# Patient Record
Sex: Female | Born: 1945 | Race: Black or African American | Hispanic: No | Marital: Single | Smoking: Never smoker
Health system: Southern US, Community
[De-identification: ages and names within clinical notes are randomized; demographics above are authoritative.]

## PROBLEM LIST (undated history)

## (undated) DIAGNOSIS — T753XXA Motion sickness, initial encounter: Secondary | ICD-10-CM

## (undated) DIAGNOSIS — M1990 Unspecified osteoarthritis, unspecified site: Secondary | ICD-10-CM

## (undated) DIAGNOSIS — R7303 Prediabetes: Secondary | ICD-10-CM

## (undated) DIAGNOSIS — I10 Essential (primary) hypertension: Secondary | ICD-10-CM

## (undated) HISTORY — DX: Prediabetes: R73.03

## (undated) HISTORY — PX: ABDOMINAL HYSTERECTOMY: SHX81

## (undated) HISTORY — PX: CYST EXCISION: SHX5701

## (undated) HISTORY — PX: TONSILLECTOMY: SUR1361

## (undated) HISTORY — PX: FOOT SURGERY: SHX648

---

## 2008-12-09 ENCOUNTER — Ambulatory Visit: Payer: Self-pay | Admitting: Family Medicine

## 2011-08-29 ENCOUNTER — Ambulatory Visit: Payer: Self-pay

## 2011-08-31 ENCOUNTER — Ambulatory Visit: Payer: Self-pay | Admitting: Podiatry

## 2011-12-26 ENCOUNTER — Emergency Department: Payer: Self-pay | Admitting: Emergency Medicine

## 2012-05-24 ENCOUNTER — Ambulatory Visit: Payer: Self-pay | Admitting: Ophthalmology

## 2012-06-06 ENCOUNTER — Ambulatory Visit: Payer: Self-pay | Admitting: Ophthalmology

## 2012-08-31 ENCOUNTER — Ambulatory Visit: Payer: Self-pay

## 2013-09-24 ENCOUNTER — Ambulatory Visit: Payer: Self-pay

## 2014-04-22 ENCOUNTER — Ambulatory Visit: Payer: Self-pay | Admitting: Internal Medicine

## 2014-04-28 ENCOUNTER — Ambulatory Visit: Payer: Self-pay | Admitting: Gastroenterology

## 2015-05-12 DIAGNOSIS — I10 Essential (primary) hypertension: Secondary | ICD-10-CM | POA: Diagnosis not present

## 2015-05-12 DIAGNOSIS — M810 Age-related osteoporosis without current pathological fracture: Secondary | ICD-10-CM | POA: Diagnosis not present

## 2015-08-25 DIAGNOSIS — L309 Dermatitis, unspecified: Secondary | ICD-10-CM | POA: Diagnosis not present

## 2015-09-02 DIAGNOSIS — L309 Dermatitis, unspecified: Secondary | ICD-10-CM | POA: Diagnosis not present

## 2015-09-02 DIAGNOSIS — I10 Essential (primary) hypertension: Secondary | ICD-10-CM | POA: Diagnosis not present

## 2015-10-29 DIAGNOSIS — R87810 Cervical high risk human papillomavirus (HPV) DNA test positive: Secondary | ICD-10-CM | POA: Diagnosis not present

## 2015-10-29 DIAGNOSIS — M79672 Pain in left foot: Secondary | ICD-10-CM | POA: Diagnosis not present

## 2015-10-29 DIAGNOSIS — M810 Age-related osteoporosis without current pathological fracture: Secondary | ICD-10-CM | POA: Diagnosis not present

## 2015-10-29 DIAGNOSIS — D485 Neoplasm of uncertain behavior of skin: Secondary | ICD-10-CM | POA: Diagnosis not present

## 2015-10-29 DIAGNOSIS — Z124 Encounter for screening for malignant neoplasm of cervix: Secondary | ICD-10-CM | POA: Diagnosis not present

## 2015-10-29 DIAGNOSIS — I10 Essential (primary) hypertension: Secondary | ICD-10-CM | POA: Diagnosis not present

## 2015-10-29 DIAGNOSIS — Z0001 Encounter for general adult medical examination with abnormal findings: Secondary | ICD-10-CM | POA: Diagnosis not present

## 2015-11-09 ENCOUNTER — Ambulatory Visit
Admission: RE | Admit: 2015-11-09 | Discharge: 2015-11-09 | Disposition: A | Discharge status: Home / Self Care | Payer: Medicare Other | Source: Ambulatory Visit | Attending: Nurse Practitioner | Admitting: Nurse Practitioner

## 2015-11-09 ENCOUNTER — Other Ambulatory Visit: Payer: Self-pay | Admitting: Nurse Practitioner

## 2015-11-09 DIAGNOSIS — Z1231 Encounter for screening mammogram for malignant neoplasm of breast: Secondary | ICD-10-CM | POA: Diagnosis not present

## 2015-11-09 DIAGNOSIS — M25552 Pain in left hip: Secondary | ICD-10-CM | POA: Insufficient documentation

## 2015-11-09 DIAGNOSIS — Z0001 Encounter for general adult medical examination with abnormal findings: Secondary | ICD-10-CM | POA: Diagnosis not present

## 2015-11-09 DIAGNOSIS — E559 Vitamin D deficiency, unspecified: Secondary | ICD-10-CM | POA: Diagnosis not present

## 2015-11-09 DIAGNOSIS — E782 Mixed hyperlipidemia: Secondary | ICD-10-CM | POA: Diagnosis not present

## 2015-11-09 DIAGNOSIS — I10 Essential (primary) hypertension: Secondary | ICD-10-CM | POA: Diagnosis not present

## 2016-01-13 DIAGNOSIS — L821 Other seborrheic keratosis: Secondary | ICD-10-CM | POA: Diagnosis not present

## 2016-07-01 DIAGNOSIS — M818 Other osteoporosis without current pathological fracture: Secondary | ICD-10-CM | POA: Diagnosis not present

## 2016-07-01 DIAGNOSIS — Z0000 Encounter for general adult medical examination without abnormal findings: Secondary | ICD-10-CM | POA: Diagnosis not present

## 2016-07-01 DIAGNOSIS — I10 Essential (primary) hypertension: Secondary | ICD-10-CM | POA: Diagnosis not present

## 2016-10-19 DIAGNOSIS — H2513 Age-related nuclear cataract, bilateral: Secondary | ICD-10-CM | POA: Diagnosis not present

## 2016-10-24 DIAGNOSIS — H40003 Preglaucoma, unspecified, bilateral: Secondary | ICD-10-CM | POA: Diagnosis not present

## 2016-11-09 DIAGNOSIS — M810 Age-related osteoporosis without current pathological fracture: Secondary | ICD-10-CM | POA: Diagnosis not present

## 2016-11-09 DIAGNOSIS — I10 Essential (primary) hypertension: Secondary | ICD-10-CM | POA: Diagnosis not present

## 2016-11-09 DIAGNOSIS — R079 Chest pain, unspecified: Secondary | ICD-10-CM | POA: Diagnosis not present

## 2016-11-18 DIAGNOSIS — M8589 Other specified disorders of bone density and structure, multiple sites: Secondary | ICD-10-CM | POA: Diagnosis not present

## 2016-11-18 DIAGNOSIS — M8588 Other specified disorders of bone density and structure, other site: Secondary | ICD-10-CM | POA: Diagnosis not present

## 2016-11-18 DIAGNOSIS — I10 Essential (primary) hypertension: Secondary | ICD-10-CM | POA: Diagnosis not present

## 2016-11-18 DIAGNOSIS — Z1231 Encounter for screening mammogram for malignant neoplasm of breast: Secondary | ICD-10-CM | POA: Diagnosis not present

## 2016-11-18 DIAGNOSIS — M85852 Other specified disorders of bone density and structure, left thigh: Secondary | ICD-10-CM | POA: Diagnosis not present

## 2016-11-18 DIAGNOSIS — E282 Polycystic ovarian syndrome: Secondary | ICD-10-CM | POA: Diagnosis not present

## 2016-11-18 DIAGNOSIS — Z0001 Encounter for general adult medical examination with abnormal findings: Secondary | ICD-10-CM | POA: Diagnosis not present

## 2016-11-18 DIAGNOSIS — E559 Vitamin D deficiency, unspecified: Secondary | ICD-10-CM | POA: Diagnosis not present

## 2016-11-18 DIAGNOSIS — Z780 Asymptomatic menopausal state: Secondary | ICD-10-CM | POA: Diagnosis not present

## 2016-11-23 DIAGNOSIS — H2513 Age-related nuclear cataract, bilateral: Secondary | ICD-10-CM | POA: Diagnosis not present

## 2016-11-30 ENCOUNTER — Encounter: Payer: Self-pay | Admitting: *Deleted

## 2016-12-01 NOTE — Discharge Instructions (Signed)
Cataract Surgery, Care After °Refer to this sheet in the next few weeks. These instructions provide you with information about caring for yourself after your procedure. Your health care provider may also give you more specific instructions. Your treatment has been planned according to current medical practices, but problems sometimes occur. Call your health care provider if you have any problems or questions after your procedure. °What can I expect after the procedure? °After the procedure, it is common to have: °· Itching. °· Discomfort. °· Fluid discharge. °· Sensitivity to light and to touch. °· Bruising. °Follow these instructions at home: °Eye Care  °· Check your eye every day for signs of infection. Watch for: °¨ Redness, swelling, or pain. °¨ Fluid, blood, or pus. °¨ Warmth. °¨ Bad smell. °Activity  °· Avoid strenuous activities, such as playing contact sports, for as long as told by your health care provider. °· Do not drive or operate heavy machinery until your health care provider approves. °· Do not bend or lift heavy objects . Bending increases pressure in the eye. You can walk, climb stairs, and do light household chores. °· Ask your health care provider when you can return to work. If you work in a dusty environment, you may be advised to wear protective eyewear for a period of time. °General instructions  °· Take or apply over-the-counter and prescription medicines only as told by your health care provider. This includes eye drops. °· Do not touch or rub your eyes. °· If you were given a protective shield, wear it as told by your health care provider. If you were not given a protective shield, wear sunglasses as told by your health care provider to protect your eyes. °· Keep the area around your eye clean and dry. Avoid swimming or allowing water to hit you directly in the face while showering until told by your health care provider. Keep soap and shampoo out of your eyes. °· Do not put a contact lens  into the affected eye or eyes until your health care provider approves. °· Keep all follow-up visits as told by your health care provider. This is important. °Contact a health care provider if: ° °· You have increased bruising around your eye. °· You have pain that is not helped with medicine. °· You have a fever. °· You have redness, swelling, or pain in your eye. °· You have fluid, blood, or pus coming from your incision. °· Your vision gets worse. °Get help right away if: °· You have sudden vision loss. °This information is not intended to replace advice given to you by your health care provider. Make sure you discuss any questions you have with your health care provider. °Document Released: 03/18/2005 Document Revised: 01/07/2016 Document Reviewed: 07/09/2015 °Elsevier Interactive Patient Education © 2017 [employer] ° ° ° ° °General Anesthesia, Adult, Care After °These instructions provide you with information about caring for yourself after your procedure. Your health care provider may also give you more specific instructions. Your treatment has been planned according to current medical practices, but problems sometimes occur. Call your health care provider if you have any problems or questions after your procedure. °What can I expect after the procedure? °After the procedure, it is common to have: °· Vomiting. °· A sore throat. °· Mental slowness. °It is common to feel: °· Nauseous. °· Cold or shivery. °· Sleepy. °· Tired. °· Sore or achy, even in parts of your body where you did not have surgery. °Follow these instructions at   home: °For at least 24 hours after the procedure:  °· Do not: °¨ Participate in activities where you could fall or become injured. °¨ Drive. °¨ Use heavy machinery. °¨ Drink alcohol. °¨ Take sleeping pills or medicines that cause drowsiness. °¨ Make important decisions or sign legal documents. °¨ Take care of children on your own. °· Rest. °Eating and drinking  °· If you vomit, drink  water, juice, or soup when you can drink without vomiting. °· Drink enough fluid to keep your urine clear or pale yellow. °· Make sure you have little or no nausea before eating solid foods. °· Follow the diet recommended by your health care provider. °General instructions  °· Have a responsible adult stay with you until you are awake and alert. °· Return to your normal activities as told by your health care provider. Ask your health care provider what activities are safe for you. °· Take over-the-counter and prescription medicines only as told by your health care provider. °· If you smoke, do not smoke without supervision. °· Keep all follow-up visits as told by your health care provider. This is important. °Contact a health care provider if: °· You continue to have nausea or vomiting at home, and medicines are not helpful. °· You cannot drink fluids or start eating again. °· You cannot urinate after 8-12 hours. °· You develop a skin rash. °· You have fever. °· You have increasing redness at the site of your procedure. °Get help right away if: °· You have difficulty breathing. °· You have chest pain. °· You have unexpected bleeding. °· You feel that you are having a life-threatening or urgent problem. °This information is not intended to replace advice given to you by your health care provider. Make sure you discuss any questions you have with your health care provider. °Document Released: 12/05/2000 Document Revised: 02/01/2016 Document Reviewed: 08/13/2015 °Elsevier Interactive Patient Education © 2017 [employer] ° °

## 2016-12-06 ENCOUNTER — Ambulatory Visit: Payer: Medicare HMO | Admitting: Anesthesiology

## 2016-12-06 ENCOUNTER — Ambulatory Visit
Admission: RE | Admit: 2016-12-06 | Discharge: 2016-12-06 | Disposition: A | Discharge status: Home / Self Care | Payer: Medicare HMO | Source: Ambulatory Visit | Attending: Ophthalmology | Admitting: Ophthalmology

## 2016-12-06 ENCOUNTER — Encounter
Admission: RE | Disposition: A | Discharge status: Home / Self Care | Payer: Self-pay | Source: Ambulatory Visit | Attending: Ophthalmology

## 2016-12-06 DIAGNOSIS — Z79899 Other long term (current) drug therapy: Secondary | ICD-10-CM | POA: Diagnosis not present

## 2016-12-06 DIAGNOSIS — H2181 Floppy iris syndrome: Secondary | ICD-10-CM | POA: Diagnosis not present

## 2016-12-06 DIAGNOSIS — H2512 Age-related nuclear cataract, left eye: Secondary | ICD-10-CM | POA: Insufficient documentation

## 2016-12-06 DIAGNOSIS — M1990 Unspecified osteoarthritis, unspecified site: Secondary | ICD-10-CM | POA: Insufficient documentation

## 2016-12-06 DIAGNOSIS — H2513 Age-related nuclear cataract, bilateral: Secondary | ICD-10-CM | POA: Diagnosis not present

## 2016-12-06 DIAGNOSIS — I10 Essential (primary) hypertension: Secondary | ICD-10-CM | POA: Insufficient documentation

## 2016-12-06 HISTORY — PX: CATARACT EXTRACTION W/PHACO: SHX586

## 2016-12-06 HISTORY — DX: Unspecified osteoarthritis, unspecified site: M19.90

## 2016-12-06 HISTORY — DX: Essential (primary) hypertension: I10

## 2016-12-06 HISTORY — DX: Motion sickness, initial encounter: T75.3XXA

## 2016-12-06 SURGERY — PHACOEMULSIFICATION, CATARACT, WITH IOL INSERTION
Anesthesia: Monitor Anesthesia Care | Site: Eye | Laterality: Left | Wound class: Clean

## 2016-12-06 MED ORDER — FENTANYL CITRATE (PF) 100 MCG/2ML IJ SOLN
INTRAMUSCULAR | Status: DC | PRN
  Administered 2016-12-06: 100 ug via INTRAVENOUS

## 2016-12-06 MED ORDER — ARMC OPHTHALMIC DILATING DROPS
1.0000 | OPHTHALMIC | Status: DC | PRN
Start: 2016-12-06 — End: 2016-12-06
  Administered 2016-12-06 (×3): 1 via OPHTHALMIC

## 2016-12-06 MED ORDER — MIDAZOLAM HCL 2 MG/2ML IJ SOLN
INTRAMUSCULAR | Status: DC | PRN
  Administered 2016-12-06: 2 mg via INTRAVENOUS

## 2016-12-06 MED ORDER — MOXIFLOXACIN HCL 0.5 % OP SOLN
OPHTHALMIC | Status: DC | PRN
  Administered 2016-12-06: .1 mL via OPHTHALMIC

## 2016-12-06 MED ORDER — BALANCED SALT IO SOLN
INTRAOCULAR | Status: DC | PRN
  Administered 2016-12-06: 1 mL via INTRAOCULAR

## 2016-12-06 MED ORDER — SODIUM HYALURONATE 23 MG/ML IO SOLN
INTRAOCULAR | Status: DC | PRN
  Administered 2016-12-06: 0.6 mL via INTRAOCULAR

## 2016-12-06 MED ORDER — BSS IO SOLN
INTRAOCULAR | Status: DC | PRN
  Administered 2016-12-06: 114 mL via OPHTHALMIC

## 2016-12-06 MED ORDER — SODIUM HYALURONATE 10 MG/ML IO SOLN
INTRAOCULAR | Status: DC | PRN
  Administered 2016-12-06: 0.55 mL via INTRAOCULAR

## 2016-12-06 SURGICAL SUPPLY — 17 items
CANNULA ANT/CHMB 27GA (MISCELLANEOUS) ×2 IMPLANT
CUP MEDICINE 2OZ PLAST GRAD ST (MISCELLANEOUS) ×2 IMPLANT
DISSECTOR HYDRO NUCLEUS 50X22 (MISCELLANEOUS) ×2 IMPLANT
GLOVE BIO SURGEON STRL SZ8 (GLOVE) ×2 IMPLANT
GLOVE SURG LX 7.5 STRW (GLOVE) ×1
GLOVE SURG LX STRL 7.5 STRW (GLOVE) ×1 IMPLANT
GOWN STRL REUS W/ TWL LRG LVL3 (GOWN DISPOSABLE) ×2 IMPLANT
GOWN STRL REUS W/TWL LRG LVL3 (GOWN DISPOSABLE) ×2
LENS IOL TECNIS ITEC 22.0 (Intraocular Lens) ×2 IMPLANT
MARKER SKIN DUAL TIP RULER LAB (MISCELLANEOUS) ×2 IMPLANT
PACK CATARACT (MISCELLANEOUS) ×2 IMPLANT
PACK CATARACT BRASINGTON (MISCELLANEOUS) ×2 IMPLANT
PACK EYE AFTER SURG (MISCELLANEOUS) ×2 IMPLANT
SYR 3ML LL SCALE MARK (SYRINGE) ×2 IMPLANT
SYR TB 1ML LUER SLIP (SYRINGE) ×2 IMPLANT
WATER STERILE IRR 250ML POUR (IV SOLUTION) ×2 IMPLANT
WIPE NON LINTING 3.25X3.25 (MISCELLANEOUS) ×2 IMPLANT

## 2016-12-06 NOTE — Anesthesia Preprocedure Evaluation (Signed)
Anesthesia Evaluation  Patient identified by MRN, date of birth, ID band Patient awake    Reviewed: Allergy & Precautions, NPO status , Patient's Chart, lab work & pertinent test results  Airway Mallampati: I  TM Distance: >3 FB Neck ROM: Full    Dental no notable dental hx.    Pulmonary neg pulmonary ROS,    Pulmonary exam normal        Cardiovascular hypertension, Pt. on medications Normal cardiovascular exam     Neuro/Psych negative neurological ROS     GI/Hepatic negative GI ROS, Neg liver ROS,   Endo/Other  negative endocrine ROS  Renal/GU negative Renal ROS     Musculoskeletal  (+) Arthritis ,   Abdominal   Peds  Hematology negative hematology ROS (+)   Anesthesia Other Findings   Reproductive/Obstetrics                             Anesthesia Physical Anesthesia Plan  ASA: II  Anesthesia Plan: MAC   Post-op Pain Management:    Induction: Intravenous  Airway Management Planned:   Additional Equipment:   Intra-op Plan:   Post-operative Plan:   Informed Consent: I have reviewed the patients History and Physical, chart, labs and discussed the procedure including the risks, benefits and alternatives for the proposed anesthesia with the patient or authorized representative who has indicated his/her understanding and acceptance.     Plan Discussed with: CRNA  Anesthesia Plan Comments:         Anesthesia Quick Evaluation

## 2016-12-06 NOTE — Anesthesia Procedure Notes (Signed)
Procedure Name: MAC Performed by: Mayme Genta Pre-anesthesia Checklist: Patient identified, Emergency Drugs available, Suction available, Timeout performed and Patient being monitored Patient Re-evaluated:Patient Re-evaluated prior to inductionOxygen Delivery Method: Nasal cannula Placement Confirmation: positive ETCO2

## 2016-12-06 NOTE — Op Note (Signed)
OPERATIVE NOTE  Lisa Thompson [id number] 12/06/2016   PREOPERATIVE DIAGNOSIS:  Nuclear sclerotic cataract left eye.  H25.12   POSTOPERATIVE DIAGNOSIS:    Nuclear sclerotic cataract left eye.   2.  Intraoperative floppy iris syndrome.  IFIS.   PROCEDURE:  Phacoemusification with posterior chamber intraocular lens placement of the left eye   LENS:   Implant Name Type Inv. Item Serial No. Manufacturer Lot No. LRB No. Used  LENS IOL DIOP 22.0 - [id number] Intraocular Lens LENS IOL DIOP 22.0 [id number] AMO   Left 1       PCB00 +22.0   ULTRASOUND TIME: 1 minutes 34 seconds.  CDE 14.34   SURGEON:  Benay Pillow, MD, MPH   ANESTHESIA:  Topical with tetracaine drops augmented with 1% preservative-free intracameral lidocaine.  ESTIMATED BLOOD LOSS: <1 mL   COMPLICATIONS:  None.   DESCRIPTION OF PROCEDURE:  The patient was identified in the holding room and transported to the operating room and placed in the supine position under the operating microscope.  The left eye was identified as the operative eye and it was prepped and draped in the usual sterile ophthalmic fashion.   A 1.0 millimeter clear-corneal paracentesis was made at the 5:00 position. 0.5 ml of preservative-free 1% lidocaine with epinephrine was injected into the anterior chamber.  The anterior chamber was filled with Healon 5 viscoelastic.  A 2.4 millimeter keratome was used to make a near-clear corneal incision at the 2:00 position.  A curvilinear capsulorrhexis was made with a cystotome and capsulorrhexis forceps.  Balanced salt solution was used to hydrodissect and hydrodelineate the nucleus.   Phacoemulsification was then used in stop and chop fashion to remove the lens nucleus and epinucleus.  The remaining cortex was then removed using the irrigation and aspiration handpiece. Healon was then placed into the capsular bag to distend it for lens placement.  A lens was then injected into the capsular bag.  The remaining  viscoelastic was aspirated.   Wounds were hydrated with balanced salt solution.  The anterior chamber was inflated to a physiologic pressure with balanced salt solution.   Intracameral vigamox 0.1 mL undiltued was injected into the eye and a drop placed onto the ocular surface.  No wound leaks were noted.  The patient was taken to the recovery room in stable condition without complications of anesthesia or surgery  The pupil was moderately sized, but OVD kept it stable during the case.  Benay Pillow 12/06/2016, 9:34 AM

## 2016-12-06 NOTE — H&P (Signed)
The History and Physical notes are on paper, have been signed, and are to be scanned.   I have examined the patient and there are no changes to the H&P.   Lisa Thompson 12/06/2016 8:45 AM

## 2016-12-06 NOTE — Anesthesia Postprocedure Evaluation (Signed)
Anesthesia Post Note  Patient: Estate manager/land agent  Procedure(s) Performed: Procedure(s) (LRB): CATARACT EXTRACTION PHACO AND INTRAOCULAR LENS PLACEMENT (IOC) (Left)  Patient location during evaluation: PACU Anesthesia Type: MAC Level of consciousness: awake and alert and oriented Pain management: pain level controlled Vital Signs Assessment: post-procedure vital signs reviewed and stable Respiratory status: spontaneous breathing and nonlabored ventilation Cardiovascular status: stable Postop Assessment: no signs of nausea or vomiting and adequate PO intake Anesthetic complications: no    Estill Batten

## 2016-12-06 NOTE — Transfer of Care (Signed)
Immediate Anesthesia Transfer of Care Note  Patient: Lisa Thompson  Procedure(s) Performed: Procedure(s) with comments: CATARACT EXTRACTION PHACO AND INTRAOCULAR LENS PLACEMENT (IOC) (Left) - IVA Topical LEFT LATEX allergy  Patient Location: PACU  Anesthesia Type: MAC  Level of Consciousness: awake, alert  and patient cooperative  Airway and Oxygen Therapy: Patient Spontanous Breathing and Patient connected to supplemental oxygen  Post-op Assessment: Post-op Vital signs reviewed, Patient's Cardiovascular Status Stable, Respiratory Function Stable, Patent Airway and No signs of Nausea or vomiting  Post-op Vital Signs: Reviewed and stable  Complications: No apparent anesthesia complications

## 2016-12-07 ENCOUNTER — Encounter: Payer: Self-pay | Admitting: Ophthalmology

## 2016-12-20 DIAGNOSIS — N183 Chronic kidney disease, stage 3 (moderate): Secondary | ICD-10-CM | POA: Diagnosis not present

## 2016-12-20 DIAGNOSIS — M810 Age-related osteoporosis without current pathological fracture: Secondary | ICD-10-CM | POA: Diagnosis not present

## 2016-12-20 DIAGNOSIS — H538 Other visual disturbances: Secondary | ICD-10-CM | POA: Diagnosis not present

## 2016-12-20 DIAGNOSIS — I10 Essential (primary) hypertension: Secondary | ICD-10-CM | POA: Diagnosis not present

## 2016-12-20 DIAGNOSIS — Z0001 Encounter for general adult medical examination with abnormal findings: Secondary | ICD-10-CM | POA: Diagnosis not present

## 2016-12-21 DIAGNOSIS — R944 Abnormal results of kidney function studies: Secondary | ICD-10-CM | POA: Diagnosis not present

## 2016-12-22 DIAGNOSIS — H2511 Age-related nuclear cataract, right eye: Secondary | ICD-10-CM | POA: Diagnosis not present

## 2016-12-28 DIAGNOSIS — I688 Other cerebrovascular disorders in diseases classified elsewhere: Secondary | ICD-10-CM | POA: Diagnosis not present

## 2016-12-29 ENCOUNTER — Encounter: Payer: Self-pay | Admitting: *Deleted

## 2016-12-30 NOTE — Discharge Instructions (Signed)
Cataract Surgery, Care After °Refer to this sheet in the next few weeks. These instructions provide you with information about caring for yourself after your procedure. Your health care provider may also give you more specific instructions. Your treatment has been planned according to current medical practices, but problems sometimes occur. Call your health care provider if you have any problems or questions after your procedure. °What can I expect after the procedure? °After the procedure, it is common to have: °· Itching. °· Discomfort. °· Fluid discharge. °· Sensitivity to light and to touch. °· Bruising. °Follow these instructions at home: °Eye Care  °· Check your eye every day for signs of infection. Watch for: °¨ Redness, swelling, or pain. °¨ Fluid, blood, or pus. °¨ Warmth. °¨ Bad smell. °Activity  °· Avoid strenuous activities, such as playing contact sports, for as long as told by your health care provider. °· Do not drive or operate heavy machinery until your health care provider approves. °· Do not bend or lift heavy objects . Bending increases pressure in the eye. You can walk, climb stairs, and do light household chores. °· Ask your health care provider when you can return to work. If you work in a dusty environment, you may be advised to wear protective eyewear for a period of time. °General instructions  °· Take or apply over-the-counter and prescription medicines only as told by your health care provider. This includes eye drops. °· Do not touch or rub your eyes. °· If you were given a protective shield, wear it as told by your health care provider. If you were not given a protective shield, wear sunglasses as told by your health care provider to protect your eyes. °· Keep the area around your eye clean and dry. Avoid swimming or allowing water to hit you directly in the face while showering until told by your health care provider. Keep soap and shampoo out of your eyes. °· Do not put a contact lens  into the affected eye or eyes until your health care provider approves. °· Keep all follow-up visits as told by your health care provider. This is important. °Contact a health care provider if: ° °· You have increased bruising around your eye. °· You have pain that is not helped with medicine. °· You have a fever. °· You have redness, swelling, or pain in your eye. °· You have fluid, blood, or pus coming from your incision. °· Your vision gets worse. °Get help right away if: °· You have sudden vision loss. °This information is not intended to replace advice given to you by your health care provider. Make sure you discuss any questions you have with your health care provider. °Document Released: 03/18/2005 Document Revised: 01/07/2016 Document Reviewed: 07/09/2015 °Elsevier Interactive Patient Education © 2017 [employer] ° ° ° ° °General Anesthesia, Adult, Care After °These instructions provide you with information about caring for yourself after your procedure. Your health care provider may also give you more specific instructions. Your treatment has been planned according to current medical practices, but problems sometimes occur. Call your health care provider if you have any problems or questions after your procedure. °What can I expect after the procedure? °After the procedure, it is common to have: °· Vomiting. °· A sore throat. °· Mental slowness. °It is common to feel: °· Nauseous. °· Cold or shivery. °· Sleepy. °· Tired. °· Sore or achy, even in parts of your body where you did not have surgery. °Follow these instructions at   home: °For at least 24 hours after the procedure:  °· Do not: °¨ Participate in activities where you could fall or become injured. °¨ Drive. °¨ Use heavy machinery. °¨ Drink alcohol. °¨ Take sleeping pills or medicines that cause drowsiness. °¨ Make important decisions or sign legal documents. °¨ Take care of children on your own. °· Rest. °Eating and drinking  °· If you vomit, drink  water, juice, or soup when you can drink without vomiting. °· Drink enough fluid to keep your urine clear or pale yellow. °· Make sure you have little or no nausea before eating solid foods. °· Follow the diet recommended by your health care provider. °General instructions  °· Have a responsible adult stay with you until you are awake and alert. °· Return to your normal activities as told by your health care provider. Ask your health care provider what activities are safe for you. °· Take over-the-counter and prescription medicines only as told by your health care provider. °· If you smoke, do not smoke without supervision. °· Keep all follow-up visits as told by your health care provider. This is important. °Contact a health care provider if: °· You continue to have nausea or vomiting at home, and medicines are not helpful. °· You cannot drink fluids or start eating again. °· You cannot urinate after 8-12 hours. °· You develop a skin rash. °· You have fever. °· You have increasing redness at the site of your procedure. °Get help right away if: °· You have difficulty breathing. °· You have chest pain. °· You have unexpected bleeding. °· You feel that you are having a life-threatening or urgent problem. °This information is not intended to replace advice given to you by your health care provider. Make sure you discuss any questions you have with your health care provider. °Document Released: 12/05/2000 Document Revised: 02/01/2016 Document Reviewed: 08/13/2015 °Elsevier Interactive Patient Education © 2017 [employer] ° °

## 2017-01-03 ENCOUNTER — Ambulatory Visit: Payer: Medicare HMO | Admitting: Anesthesiology

## 2017-01-03 ENCOUNTER — Encounter
Admission: RE | Disposition: A | Discharge status: Home / Self Care | Payer: Self-pay | Source: Ambulatory Visit | Attending: Ophthalmology

## 2017-01-03 ENCOUNTER — Ambulatory Visit
Admission: RE | Admit: 2017-01-03 | Discharge: 2017-01-03 | Disposition: A | Discharge status: Home / Self Care | Payer: Medicare HMO | Source: Ambulatory Visit | Attending: Ophthalmology | Admitting: Ophthalmology

## 2017-01-03 DIAGNOSIS — Z79899 Other long term (current) drug therapy: Secondary | ICD-10-CM | POA: Diagnosis not present

## 2017-01-03 DIAGNOSIS — I10 Essential (primary) hypertension: Secondary | ICD-10-CM | POA: Insufficient documentation

## 2017-01-03 DIAGNOSIS — H2511 Age-related nuclear cataract, right eye: Secondary | ICD-10-CM | POA: Diagnosis not present

## 2017-01-03 DIAGNOSIS — M1990 Unspecified osteoarthritis, unspecified site: Secondary | ICD-10-CM | POA: Diagnosis not present

## 2017-01-03 HISTORY — PX: CATARACT EXTRACTION W/PHACO: SHX586

## 2017-01-03 SURGERY — PHACOEMULSIFICATION, CATARACT, WITH IOL INSERTION
Anesthesia: Monitor Anesthesia Care | Site: Eye | Laterality: Right | Wound class: Clean

## 2017-01-03 MED ORDER — LACTATED RINGERS IV SOLN: INTRAVENOUS | Status: DC

## 2017-01-03 MED ORDER — LIDOCAINE HCL (PF) 2 % IJ SOLN
INTRAOCULAR | Status: DC | PRN
  Administered 2017-01-03: 2 mL via INTRAOCULAR

## 2017-01-03 MED ORDER — SODIUM HYALURONATE 10 MG/ML IO SOLN
INTRAOCULAR | Status: DC | PRN
  Administered 2017-01-03: 0.55 mL via INTRAOCULAR

## 2017-01-03 MED ORDER — MIDAZOLAM HCL 2 MG/2ML IJ SOLN
INTRAMUSCULAR | Status: DC | PRN
  Administered 2017-01-03: 2 mg via INTRAVENOUS

## 2017-01-03 MED ORDER — BSS IO SOLN
INTRAOCULAR | Status: DC | PRN
  Administered 2017-01-03: 100 mL via OPHTHALMIC

## 2017-01-03 MED ORDER — SODIUM HYALURONATE 23 MG/ML IO SOLN
INTRAOCULAR | Status: DC | PRN
  Administered 2017-01-03: 0.6 mL via INTRAOCULAR

## 2017-01-03 MED ORDER — MOXIFLOXACIN HCL 0.5 % OP SOLN
OPHTHALMIC | Status: DC | PRN
  Administered 2017-01-03: 0.2 mL via OPHTHALMIC

## 2017-01-03 MED ORDER — ARMC OPHTHALMIC DILATING DROPS
1.0000 | OPHTHALMIC | Status: DC | PRN
Start: 2017-01-03 — End: 2017-01-03
  Administered 2017-01-03 (×3): 1 via OPHTHALMIC

## 2017-01-03 MED ORDER — FENTANYL CITRATE (PF) 100 MCG/2ML IJ SOLN
INTRAMUSCULAR | Status: DC | PRN
  Administered 2017-01-03 (×2): 50 ug via INTRAVENOUS

## 2017-01-03 SURGICAL SUPPLY — 16 items

## 2017-01-03 NOTE — Transfer of Care (Signed)
Immediate Anesthesia Transfer of Care Note  Patient: Lisa Thompson  Procedure(s) Performed: Procedure(s) with comments: CATARACT EXTRACTION PHACO AND INTRAOCULAR LENS PLACEMENT (IOC) RIGHT (Right) - IVA TOPICAL latex sensitivity  Patient Location: PACU  Anesthesia Type: MAC  Level of Consciousness: awake, alert  and patient cooperative  Airway and Oxygen Therapy: Patient Spontanous Breathing and Patient connected to supplemental oxygen  Post-op Assessment: Post-op Vital signs reviewed, Patient's Cardiovascular Status Stable, Respiratory Function Stable, Patent Airway and No signs of Nausea or vomiting  Post-op Vital Signs: Reviewed and stable  Complications: No apparent anesthesia complications

## 2017-01-03 NOTE — H&P (Signed)
The History and Physical notes are on paper, have been signed, and are to be scanned.   I have examined the patient and there are no changes to the H&P.   Benay Pillow 01/03/2017 8:06 AM

## 2017-01-03 NOTE — Anesthesia Procedure Notes (Signed)
Procedure Name: MAC Performed by: Gurjot Brisco Pre-anesthesia Checklist: Patient identified, Emergency Drugs available, Suction available, Timeout performed and Patient being monitored Patient Re-evaluated:Patient Re-evaluated prior to inductionOxygen Delivery Method: Nasal cannula Placement Confirmation: positive ETCO2     

## 2017-01-03 NOTE — Anesthesia Preprocedure Evaluation (Signed)
Anesthesia Evaluation  Patient identified by MRN, date of birth, ID band Patient awake    Reviewed: Allergy & Precautions, NPO status , Patient's Chart, lab work & pertinent test results  Airway Mallampati: I  TM Distance: >3 FB Neck ROM: Full    Dental no notable dental hx.    Pulmonary neg pulmonary ROS,    Pulmonary exam normal        Cardiovascular hypertension, Pt. on medications Normal cardiovascular exam     Neuro/Psych negative neurological ROS     GI/Hepatic negative GI ROS, Neg liver ROS,   Endo/Other  negative endocrine ROS  Renal/GU negative Renal ROS     Musculoskeletal  (+) Arthritis ,   Abdominal   Peds  Hematology negative hematology ROS (+)   Anesthesia Other Findings   Reproductive/Obstetrics                             Anesthesia Physical Anesthesia Plan  ASA: II  Anesthesia Plan: MAC   Post-op Pain Management:    Induction: Intravenous  Airway Management Planned:   Additional Equipment:   Intra-op Plan:   Post-operative Plan:   Informed Consent: I have reviewed the patients History and Physical, chart, labs and discussed the procedure including the risks, benefits and alternatives for the proposed anesthesia with the patient or authorized representative who has indicated his/her understanding and acceptance.     Plan Discussed with: CRNA  Anesthesia Plan Comments:         Anesthesia Quick Evaluation  

## 2017-01-03 NOTE — Op Note (Signed)
OPERATIVE NOTE  Lisa Thompson [id number] 01/03/2017   PREOPERATIVE DIAGNOSIS:  Nuclear sclerotic cataract right eye.  H25.11   POSTOPERATIVE DIAGNOSIS:    Nuclear sclerotic cataract right eye.     PROCEDURE:  Phacoemusification with posterior chamber intraocular lens placement of the right eye   LENS:   Implant Name Type Inv. Item Serial No. Manufacturer Lot No. LRB No. Used  LENS IOL DIOP 22.0 - [id number] Intraocular Lens LENS IOL DIOP 22.0 [id number] AMO   Right 1       PCB00 +22.0   ULTRASOUND TIME: 0 minutes 38.2 seconds.  CDE 5.62   SURGEON:  Benay Pillow, MD, MPH  ANESTHESIOLOGIST: Anesthesiologist: Idelia Salm, MD CRNA: Londell Moh, CRNA   ANESTHESIA:  Topical with tetracaine drops augmented with 1% preservative-free intracameral lidocaine.  ESTIMATED BLOOD LOSS: less than 1 mL.   COMPLICATIONS:  None.   DESCRIPTION OF PROCEDURE:  The patient was identified in the holding room and transported to the operating room and placed in the supine position under the operating microscope.  The right eye was identified as the operative eye and it was prepped and draped in the usual sterile ophthalmic fashion.   A 1.0 millimeter clear-corneal paracentesis was made at the 10:30 position. 0.5 ml of preservative-free 1% lidocaine with epinephrine was injected into the anterior chamber.  The anterior chamber was filled with Healon 5 viscoelastic.  A 2.4 millimeter keratome was used to make a near-clear corneal incision at the 8:00 position.  A curvilinear capsulorrhexis was made with a cystotome and capsulorrhexis forceps.  Balanced salt solution was used to hydrodissect and hydrodelineate the nucleus.   Phacoemulsification was then used in stop and chop fashion to remove the lens nucleus and epinucleus.  The remaining cortex was then removed using the irrigation and aspiration handpiece. Healon was then placed into the capsular bag to distend it for lens placement.  A lens was  then injected into the capsular bag.  The remaining viscoelastic was aspirated.   Wounds were hydrated with balanced salt solution.  The anterior chamber was inflated to a physiologic pressure with balanced salt solution.   Intracameral vigamox 0.1 mL undiluted was injected into the eye and a drop placed onto the ocular surface.  No wound leaks were noted.  The patient was taken to the recovery room in stable condition without complications of anesthesia or surgery  Benay Pillow 01/03/2017, 8:46 AM

## 2017-01-03 NOTE — Anesthesia Postprocedure Evaluation (Signed)
Anesthesia Post Note  Patient: Estate manager/land agent  Procedure(s) Performed: Procedure(s) (LRB): CATARACT EXTRACTION PHACO AND INTRAOCULAR LENS PLACEMENT (IOC) RIGHT (Right)  Patient location during evaluation: PACU Anesthesia Type: MAC Level of consciousness: awake and alert Pain management: pain level controlled Vital Signs Assessment: post-procedure vital signs reviewed and stable Respiratory status: spontaneous breathing, nonlabored ventilation, respiratory function stable and patient connected to nasal cannula oxygen Cardiovascular status: stable and blood pressure returned to baseline Anesthetic complications: no    Marshell Levan

## 2017-01-04 ENCOUNTER — Encounter: Payer: Self-pay | Admitting: Ophthalmology

## 2017-02-21 DIAGNOSIS — I10 Essential (primary) hypertension: Secondary | ICD-10-CM | POA: Diagnosis not present

## 2017-02-21 DIAGNOSIS — N183 Chronic kidney disease, stage 3 (moderate): Secondary | ICD-10-CM | POA: Diagnosis not present

## 2017-02-21 DIAGNOSIS — I688 Other cerebrovascular disorders in diseases classified elsewhere: Secondary | ICD-10-CM | POA: Diagnosis not present

## 2017-03-03 DIAGNOSIS — H40003 Preglaucoma, unspecified, bilateral: Secondary | ICD-10-CM | POA: Diagnosis not present

## 2017-03-27 DIAGNOSIS — H1045 Other chronic allergic conjunctivitis: Secondary | ICD-10-CM | POA: Diagnosis not present

## 2017-03-27 DIAGNOSIS — L309 Dermatitis, unspecified: Secondary | ICD-10-CM | POA: Diagnosis not present

## 2017-03-27 DIAGNOSIS — I10 Essential (primary) hypertension: Secondary | ICD-10-CM | POA: Diagnosis not present

## 2017-04-04 DIAGNOSIS — M109 Gout, unspecified: Secondary | ICD-10-CM | POA: Diagnosis not present

## 2017-04-04 DIAGNOSIS — I10 Essential (primary) hypertension: Secondary | ICD-10-CM | POA: Diagnosis not present

## 2017-04-26 DIAGNOSIS — Z961 Presence of intraocular lens: Secondary | ICD-10-CM | POA: Diagnosis not present

## 2017-06-29 DIAGNOSIS — M5010 Cervical disc disorder with radiculopathy, unspecified cervical region: Secondary | ICD-10-CM | POA: Diagnosis not present

## 2017-06-29 DIAGNOSIS — Z23 Encounter for immunization: Secondary | ICD-10-CM | POA: Diagnosis not present

## 2017-06-29 DIAGNOSIS — I10 Essential (primary) hypertension: Secondary | ICD-10-CM | POA: Diagnosis not present

## 2017-07-14 DIAGNOSIS — Z961 Presence of intraocular lens: Secondary | ICD-10-CM | POA: Diagnosis not present

## 2017-10-25 ENCOUNTER — Ambulatory Visit
Admission: RE | Admit: 2017-10-25 | Discharge: 2017-10-25 | Disposition: A | Discharge status: Home / Self Care | Payer: Medicare HMO | Source: Ambulatory Visit | Attending: Nurse Practitioner | Admitting: Nurse Practitioner

## 2017-10-25 ENCOUNTER — Other Ambulatory Visit: Payer: Self-pay | Admitting: Nurse Practitioner

## 2017-10-25 DIAGNOSIS — M5030 Other cervical disc degeneration, unspecified cervical region: Secondary | ICD-10-CM | POA: Insufficient documentation

## 2017-10-25 DIAGNOSIS — R52 Pain, unspecified: Secondary | ICD-10-CM

## 2017-10-25 DIAGNOSIS — M542 Cervicalgia: Secondary | ICD-10-CM | POA: Diagnosis not present

## 2017-10-25 DIAGNOSIS — M50323 Other cervical disc degeneration at C6-C7 level: Secondary | ICD-10-CM | POA: Diagnosis not present

## 2017-10-30 ENCOUNTER — Encounter: Payer: Self-pay | Admitting: Nurse Practitioner

## 2017-10-30 ENCOUNTER — Ambulatory Visit (INDEPENDENT_AMBULATORY_CARE_PROVIDER_SITE_OTHER): Payer: Medicare HMO | Admitting: Nurse Practitioner

## 2017-10-30 VITALS — BP 119/75 | HR 78 | Resp 16 | Ht 65.0 in | Wt 163.0 lb

## 2017-10-30 DIAGNOSIS — I10 Essential (primary) hypertension: Secondary | ICD-10-CM | POA: Diagnosis not present

## 2017-10-30 DIAGNOSIS — M50122 Cervical disc disorder at C5-C6 level with radiculopathy: Secondary | ICD-10-CM

## 2017-10-30 DIAGNOSIS — Z8249 Family history of ischemic heart disease and other diseases of the circulatory system: Secondary | ICD-10-CM | POA: Diagnosis not present

## 2017-10-30 DIAGNOSIS — Z7722 Contact with and (suspected) exposure to environmental tobacco smoke (acute) (chronic): Secondary | ICD-10-CM | POA: Diagnosis not present

## 2017-10-30 DIAGNOSIS — Z809 Family history of malignant neoplasm, unspecified: Secondary | ICD-10-CM | POA: Diagnosis not present

## 2017-10-30 DIAGNOSIS — Z886 Allergy status to analgesic agent status: Secondary | ICD-10-CM | POA: Diagnosis not present

## 2017-10-30 DIAGNOSIS — Z87892 Personal history of anaphylaxis: Secondary | ICD-10-CM | POA: Diagnosis not present

## 2017-10-30 DIAGNOSIS — Z833 Family history of diabetes mellitus: Secondary | ICD-10-CM | POA: Diagnosis not present

## 2017-10-30 NOTE — Progress Notes (Signed)
[facility] [place], [place] [address]  Internal MEDICINE  Office Visit Note  Patient Name: Lisa Thompson  [id number]  [id number]  Date of Service: 10/30/2017  Chief Complaint  Patient presents with  . Neck Pain    The patient is here for follow up exm. She was having some neck pain with bilateral shoulder and arm heaviness at her last visit. A neck x-ray had been ordered. She had this done last Wednesday and she is here to review results.    Neck Pain   This is a recurrent problem. The current episode started in the past 7 days. The problem has been gradually improving. The pain is associated with nothing. The pain is present in the right side and occipital region. The quality of the pain is described as aching. The pain is mild. Nothing aggravates the symptoms. Stiffness is present in the morning. Associated symptoms include headaches. Pertinent negatives include no chest pain or numbness. She has tried nothing for the symptoms.    Pt is here for routine follow up.    Current Medication: Outpatient Encounter Medications as of 10/30/2017  Medication Sig  . amLODipine (NORVASC) 2.5 MG tablet Take 5 mg by mouth daily.  . bisoprolol (ZEBETA) 5 MG tablet Take 5 mg by mouth daily.  . diphenhydrAMINE (BENADRYL) 25 MG tablet Take 25 mg by mouth as needed.  . Multiple Vitamins-Minerals (CENTRUM SILVER ADULT 50+ PO) Take by mouth daily.  Vladimir Faster Glycol-Propyl Glycol (SYSTANE OP) Apply to eye as needed.  . triamcinolone (KENALOG) 0.025 % ointment Apply 1 application topically 2 (two) times daily as needed.   No facility-administered encounter medications on file as of 10/30/2017.     Surgical History: Past Surgical History:  Procedure Laterality Date  . ABDOMINAL HYSTERECTOMY    . CATARACT EXTRACTION W/PHACO Left 12/06/2016   Procedure: CATARACT EXTRACTION PHACO AND INTRAOCULAR LENS PLACEMENT (IOC);  Surgeon: Eulogio Bear, MD;  Location: [facility];  Service: Ophthalmology;  Laterality: Left;  IVA Topical LEFT LATEX allergy  . CATARACT EXTRACTION W/PHACO Right 01/03/2017   Procedure: CATARACT EXTRACTION PHACO AND INTRAOCULAR LENS PLACEMENT ([place]) RIGHT;  Surgeon: Eulogio Bear, MD;  Location: [facility];  Service: Ophthalmology;  Laterality: Right;  IVA TOPICAL latex sensitivity  . CYST EXCISION     multiple. in different areas  . FOOT SURGERY  1997, 2007  . TONSILLECTOMY      Medical History: Past Medical History:  Diagnosis Date  . Arthritis    lower back  . Hypertension   . Motion sickness     Family History: No family history on file.  Social History   Socioeconomic History  . Marital status: Single    Spouse name: Not on file  . Number of children: Not on file  . Years of education: Not on file  . Highest education level: Not on file  Social Needs  . Financial resource strain: Not on file  . Food insecurity - worry: Not on file  . Food insecurity - inability: Not on file  . Transportation needs - medical: Not on file  . Transportation needs - non-medical: Not on file  Occupational History  . Not on file  Tobacco Use  . Smoking status: Never Smoker  . Smokeless tobacco: Never Used  Substance and Sexual Activity  . Alcohol use: Yes    Comment: rare - holidays  . Drug use: Not on file  . Sexual activity: Not on file  Other Topics Concern  . Not on file  Social History Narrative  . Not on file      Review of Systems  Constitutional: Negative for activity change, chills, fatigue and unexpected weight change.  HENT: Negative for congestion, postnasal drip, rhinorrhea, sneezing and sore throat.   Eyes: Negative.  Negative for redness.  Respiratory: Negative for apnea, cough, chest tightness and shortness of breath.   Cardiovascular: Negative for chest pain and palpitations.  Gastrointestinal: Negative for abdominal pain, constipation, diarrhea, nausea and vomiting.  Endocrine:  Negative for heat intolerance, polydipsia and polyuria.  Genitourinary: Negative for dysuria and frequency.  Musculoskeletal: Positive for arthralgias, myalgias and neck pain. Negative for back pain and joint swelling.  Skin: Negative for rash.  Allergic/Immunologic: Negative for environmental allergies.  Neurological: Positive for headaches. Negative for tremors and numbness.  Hematological: Negative for adenopathy. Does not bruise/bleed easily.  Psychiatric/Behavioral: Negative for agitation, behavioral problems (Depression), sleep disturbance and suicidal ideas. The patient is not nervous/anxious.     Today's Vitals   10/30/17 0859  BP: 119/75  Pulse: 78  Resp: 16  SpO2: 98%  Weight: 163 lb (73.9 kg)  Height: 5\' 5"  (1.651 m)    Physical Exam  Constitutional: She is oriented to person, place, and time. She appears well-developed and well-nourished. No distress.  HENT:  Head: Normocephalic and atraumatic.  Mouth/Throat: Oropharynx is clear and moist. No oropharyngeal exudate.  Eyes: EOM are normal. Pupils are equal, round, and reactive to light.  Neck: No JVD present. Spinous process tenderness and muscular tenderness present. Decreased range of motion present. No tracheal deviation present. No thyromegaly present.  Cardiovascular: Normal rate, regular rhythm and normal heart sounds. Exam reveals no gallop and no friction rub.  No murmur heard. Pulmonary/Chest: Effort normal and breath sounds normal. No respiratory distress. She has no wheezes. She has no rales. She exhibits no tenderness.  Abdominal: Soft. Bowel sounds are normal. There is no tenderness.  Lymphadenopathy:    She has no cervical adenopathy.  Neurological: She is alert and oriented to person, place, and time. No cranial nerve deficit.  Skin: Skin is warm and dry. She is not diaphoretic.  Psychiatric: She has a normal mood and affect. Her behavior is normal. Judgment and thought content normal.  Nursing note and  vitals reviewed.   Assessment/Plan:  1. Cervical disc disorder at C5-C6 level with radiculopathy Reviewed x-ray of cervical spine, showing degenerative disc disease with foraminal hypertrophy at C5/C6 and C6/C7 levels, worse on right than left. Recommend OTC tylenol as needed for pain/ examples of neck exercises were reviewed with patient. Will revaluate as indicated.   2. Essential hypertension Generally stable. Continue bp medication as prescribed .  General Counseling: Markella verbalizes understanding of the findings of todays visit and agrees with plan of treatment. I have discussed any further diagnostic evaluation that may be needed or ordered today. We also reviewed her medications today. she has been encouraged to call the office with any questions or concerns that should arise related to todays visit.  This patient was seen by Leretha Pol, FNP- C in Collaboration with Dr Lavera Guise as a part of collaborative care agreement      Time spent: 51 Minutes   Dr Lavera Guise Internal medicine

## 2017-12-18 ENCOUNTER — Other Ambulatory Visit: Payer: Self-pay | Admitting: Internal Medicine

## 2017-12-21 ENCOUNTER — Other Ambulatory Visit: Payer: Self-pay

## 2017-12-21 MED ORDER — BISOPROLOL FUMARATE 5 MG PO TABS: 5.0000 mg | ORAL_TABLET | Freq: Every day | ORAL | 3 refills | Status: DC

## 2018-01-04 ENCOUNTER — Emergency Department
Admission: EM | Admit: 2018-01-04 | Discharge: 2018-01-04 | Disposition: A | Discharge status: Home / Self Care | Payer: Medicare HMO | Attending: Emergency Medicine | Admitting: Emergency Medicine

## 2018-01-04 ENCOUNTER — Other Ambulatory Visit: Payer: Self-pay

## 2018-01-04 DIAGNOSIS — Z79899 Other long term (current) drug therapy: Secondary | ICD-10-CM | POA: Diagnosis not present

## 2018-01-04 DIAGNOSIS — R300 Dysuria: Secondary | ICD-10-CM | POA: Diagnosis not present

## 2018-01-04 DIAGNOSIS — B029 Zoster without complications: Secondary | ICD-10-CM

## 2018-01-04 DIAGNOSIS — Z91040 Latex allergy status: Secondary | ICD-10-CM | POA: Insufficient documentation

## 2018-01-04 DIAGNOSIS — I10 Essential (primary) hypertension: Secondary | ICD-10-CM | POA: Diagnosis not present

## 2018-01-04 DIAGNOSIS — R21 Rash and other nonspecific skin eruption: Secondary | ICD-10-CM | POA: Diagnosis not present

## 2018-01-04 LAB — URINALYSIS, COMPLETE (UACMP) WITH MICROSCOPIC
BILIRUBIN URINE: NEGATIVE
GLUCOSE, UA: NEGATIVE mg/dL
Ketones, ur: NEGATIVE mg/dL
NITRITE: NEGATIVE
PH: 6 (ref 5.0–8.0)
Protein, ur: NEGATIVE mg/dL
SPECIFIC GRAVITY, URINE: 1.008 (ref 1.005–1.030)

## 2018-01-04 MED ORDER — PREDNISONE 10 MG (21) PO TBPK: ORAL_TABLET | ORAL | 0 refills | Status: DC

## 2018-01-04 MED ORDER — FAMCICLOVIR 500 MG PO TABS: 500.0000 mg | ORAL_TABLET | Freq: Three times a day (TID) | ORAL | 0 refills | Status: DC

## 2018-01-04 MED ORDER — OXYCODONE-ACETAMINOPHEN 5-325 MG PO TABS: 1.0000 | ORAL_TABLET | ORAL | 0 refills | Status: DC | PRN

## 2018-01-04 NOTE — ED Triage Notes (Addendum)
Pt c/o rash on lower abd that raps around to back and right hip/buttocks - the rash is whelps  Pt also states that Friday she started with lower right back pain - denies difficulty with urination or pain

## 2018-01-04 NOTE — Discharge Instructions (Addendum)
Follow-up with your regular doctor.  He may want to inquire about the shingles vaccine.  You have been given an antiviral, steroid, and pain medication.  If you are worsening can see regular doctor return to the emergency department.

## 2018-01-04 NOTE — ED Provider Notes (Signed)
[facility] Emergency Department Provider Note  ____________________________________________   First MD Initiated Contact with Patient 01/04/18 1029     (approximate)  I have reviewed the triage vital signs and the nursing notes.   HISTORY  Chief Complaint Rash    HPI Lisa Thompson is a 72 y.o. female who presents emergency department complaining of right-sided low back pain since Friday.  She states she was not sure if it was her kidneys are not.  Then she woke up this morning with a rash that goes from the back to the lower abdomen and groin area.  She denies any fever or chills.  States area is very painful.  She states she is unsure if she has UTI it does burn and the urine looked cloudy.  Past Medical History:  Diagnosis Date  . Arthritis    lower back  . Hypertension   . Motion sickness     Patient Active Problem List   Diagnosis Date Noted  . Hypertension 10/30/2017  . Cervical disc disorder at C5-C6 level with radiculopathy 10/30/2017    Past Surgical History:  Procedure Laterality Date  . ABDOMINAL HYSTERECTOMY    . CATARACT EXTRACTION W/PHACO Left 12/06/2016   Procedure: CATARACT EXTRACTION PHACO AND INTRAOCULAR LENS PLACEMENT (IOC);  Surgeon: Eulogio Bear, MD;  Location: [facility];  Service: Ophthalmology;  Laterality: Left;  IVA Topical LEFT LATEX allergy  . CATARACT EXTRACTION W/PHACO Right 01/03/2017   Procedure: CATARACT EXTRACTION PHACO AND INTRAOCULAR LENS PLACEMENT ([place]) RIGHT;  Surgeon: Eulogio Bear, MD;  Location: [facility];  Service: Ophthalmology;  Laterality: Right;  IVA TOPICAL latex sensitivity  . CYST EXCISION     multiple. in different areas  . FOOT SURGERY  1997, 2007  . TONSILLECTOMY      Prior to Admission medications   Medication Sig Start Date End Date Taking? Authorizing Provider  amLODipine (NORVASC) 2.5 MG tablet Take 5 mg by mouth daily.    [provider]    bisoprolol (ZEBETA) 5 MG tablet Take 1 tablet (5 mg total) by mouth daily. 12/21/17   Ronnell Freshwater, NP  diphenhydrAMINE (BENADRYL) 25 MG tablet Take 25 mg by mouth as needed.    [provider]  famciclovir (FAMVIR) 500 MG tablet Take 1 tablet (500 mg total) by mouth 3 (three) times daily. 01/04/18   Rodderick Holtzer, Linden Dolin, PA-C  Multiple Vitamins-Minerals (CENTRUM SILVER ADULT 50+ PO) Take by mouth daily.    [provider]  oxyCODONE-acetaminophen (PERCOCET/ROXICET) 5-325 MG tablet Take 1 tablet by mouth every 4 (four) hours as needed for severe pain. 01/04/18   Caryn Section Linden Dolin, PA-C  Polyethyl Glycol-Propyl Glycol (SYSTANE OP) Apply to eye as needed.    [provider]  predniSONE (STERAPRED UNI-PAK 21 TAB) 10 MG (21) TBPK tablet Take 6 pills on day one then decrease by 1 pill each day 01/04/18   Versie Starks, PA-C  triamcinolone (KENALOG) 0.025 % ointment Apply 1 application topically 2 (two) times daily as needed.    [provider]    Allergies Aspirin; Latex; Other; and Shellfish allergy  No family history on file.  Social History Social History   Tobacco Use  . Smoking status: Never Smoker  . Smokeless tobacco: Never Used  Substance Use Topics  . Alcohol use: Yes    Comment: rare - holidays  . Drug use: Never    Review of Systems  Constitutional: No fever/chills Eyes: No visual changes. ENT:  No sore throat. Respiratory: Denies cough Genitourinary: Positive for for dysuria. Musculoskeletal: Positive for back pain. Skin: Positive for rash.    ____________________________________________   PHYSICAL EXAM:  VITAL SIGNS: ED Triage Vitals  Enc Vitals Group     BP 01/04/18 0948 (!) 152/70     Pulse Rate 01/04/18 0948 62     Resp 01/04/18 0948 16     Temp 01/04/18 0948 98.1 F (36.7 C)     Temp Source 01/04/18 0948 Oral     SpO2 01/04/18 0948 99 %     Weight 01/04/18 0949 150 lb (68 kg)     Height 01/04/18 0949 5\' 5"  (1.651 m)      Head Circumference --      Peak Flow --      Pain Score 01/04/18 0949 5     Pain Loc --      Pain Edu? --      Excl. in [place]? --     Constitutional: Alert and oriented. Well appearing and in no acute distress. Eyes: Conjunctivae are normal.  Head: Atraumatic. Nose: No congestion/rhinnorhea. Mouth/Throat: Mucous membranes are moist.   Cardiovascular: Normal rate, regular rhythm.  Sounds are normal, lungs clear to auscultation Respiratory: Normal respiratory effort.  No retractions GU: deferred Musculoskeletal: FROM all extremities, warm and well perfused Neurologic:  Normal speech and language.  Skin:  Skin is warm, dry and intact.  There is an erythematous based rash with vesicles extending from the lumbar spine around to the right groin area to the mid pubis.  There are no vaginal lesions at this time. Psychiatric: Mood and affect are normal. Speech and behavior are normal.  ____________________________________________   LABS (all labs ordered are listed, but only abnormal results are displayed)  Labs Reviewed  URINALYSIS, COMPLETE (UACMP) WITH MICROSCOPIC - Abnormal; Notable for the following components:      Result Value   Color, Urine YELLOW (*)    APPearance CLOUDY (*)    Hgb urine dipstick SMALL (*)    Leukocytes, UA TRACE (*)    Bacteria, UA RARE (*)    All other components within normal limits  URINE CULTURE   ____________________________________________   ____________________________________________  RADIOLOGY    ____________________________________________   PROCEDURES  Procedure(s) performed: No  Procedures    ____________________________________________   INITIAL IMPRESSION / ASSESSMENT AND PLAN / ED COURSE  Pertinent labs & imaging results that were available during my care of the patient were reviewed by me and considered in my medical decision making (see chart for details).  Patient is a 72 year old female presented emergency  department complaining of low back pain with a rash that goes from the lower back to the right groin area.  Physical exam the patient has a rash that is very typical of shingles.  We also ordered a urine due to her urinary symptoms.  UA showed trace of leuks.  Ordered a culture on the urine to see if the leuks or just from the shingles outbreak or if she actually has a UTI.  Explained to patient that these results would not be back for 2 days.  She states she understands.  Patient was given a prescription for Famvir, Sterapred, and Percocet.  She is to follow-up with her regular doctor if needed.  Explained to her that she may want to ask them about the shingles vaccine.  She states she understands and was discharged in stable condition     As part of my medical decision making,  I reviewed the following data within the [place] notes reviewed and incorporated, Labs reviewed UA with trace leuks, urine culture ordered and is pending, Notes from prior ED visits and [place] Controlled Substance Database  ____________________________________________   FINAL CLINICAL IMPRESSION(S) / ED DIAGNOSES  Final diagnoses:  Herpes zoster without complication      NEW MEDICATIONS STARTED DURING THIS VISIT:  Discharge Medication List as of 01/04/2018 11:23 AM    START taking these medications   Details  famciclovir (FAMVIR) 500 MG tablet Take 1 tablet (500 mg total) by mouth 3 (three) times daily., Starting Thu 01/04/2018, Print    oxyCODONE-acetaminophen (PERCOCET/ROXICET) 5-325 MG tablet Take 1 tablet by mouth every 4 (four) hours as needed for severe pain., Starting Thu 01/04/2018, Print    predniSONE (STERAPRED UNI-PAK 21 TAB) 10 MG (21) TBPK tablet Take 6 pills on day one then decrease by 1 pill each day, Print         Note:  This document was prepared using Dragon voice recognition software and may include unintentional dictation errors.    Versie Starks,  PA-C 01/04/18 1341    Earleen Newport, MD 01/04/18 [phone]

## 2018-01-04 NOTE — ED Notes (Signed)
See triage note    States she developed pain to right lower back  Woke up with rash to back moving into abd/groin  Also has an area to posterior right thigh

## 2018-01-05 LAB — URINE CULTURE
Culture: NO GROWTH
Special Requests: NORMAL

## 2018-01-22 ENCOUNTER — Other Ambulatory Visit: Payer: Self-pay | Admitting: Internal Medicine

## 2018-02-08 DIAGNOSIS — R69 Illness, unspecified: Secondary | ICD-10-CM | POA: Diagnosis not present

## 2018-02-12 DIAGNOSIS — R69 Illness, unspecified: Secondary | ICD-10-CM | POA: Diagnosis not present

## 2018-02-27 ENCOUNTER — Other Ambulatory Visit: Payer: Self-pay | Admitting: Nurse Practitioner

## 2018-02-27 ENCOUNTER — Ambulatory Visit (INDEPENDENT_AMBULATORY_CARE_PROVIDER_SITE_OTHER): Payer: Medicare HMO | Admitting: Nurse Practitioner

## 2018-02-27 ENCOUNTER — Encounter: Payer: Self-pay | Admitting: Nurse Practitioner

## 2018-02-27 VITALS — BP 135/66 | HR 60 | Resp 16 | Ht 65.0 in | Wt 164.8 lb

## 2018-02-27 DIAGNOSIS — R300 Dysuria: Secondary | ICD-10-CM | POA: Diagnosis not present

## 2018-02-27 DIAGNOSIS — R319 Hematuria, unspecified: Secondary | ICD-10-CM | POA: Diagnosis not present

## 2018-02-27 DIAGNOSIS — I10 Essential (primary) hypertension: Secondary | ICD-10-CM | POA: Diagnosis not present

## 2018-02-27 DIAGNOSIS — Z0001 Encounter for general adult medical examination with abnormal findings: Secondary | ICD-10-CM | POA: Diagnosis not present

## 2018-02-27 DIAGNOSIS — E559 Vitamin D deficiency, unspecified: Secondary | ICD-10-CM | POA: Diagnosis not present

## 2018-02-27 DIAGNOSIS — E782 Mixed hyperlipidemia: Secondary | ICD-10-CM | POA: Diagnosis not present

## 2018-02-27 DIAGNOSIS — N390 Urinary tract infection, site not specified: Secondary | ICD-10-CM

## 2018-02-27 DIAGNOSIS — Z1239 Encounter for other screening for malignant neoplasm of breast: Secondary | ICD-10-CM

## 2018-02-27 DIAGNOSIS — Z1231 Encounter for screening mammogram for malignant neoplasm of breast: Secondary | ICD-10-CM

## 2018-02-27 LAB — POCT URINALYSIS DIPSTICK
GLUCOSE UA: NEGATIVE
PROTEIN UA: POSITIVE — AB
pH, UA: 5 (ref 5.0–8.0)

## 2018-02-27 MED ORDER — NITROFURANTOIN MONOHYD MACRO 100 MG PO CAPS: 100.0000 mg | ORAL_CAPSULE | Freq: Two times a day (BID) | ORAL | 0 refills | Status: DC

## 2018-02-27 NOTE — Progress Notes (Signed)
[facility] [place], [place] [address]  Internal MEDICINE  Office Visit Note  Patient Name: Lisa Thompson  [id number]  [id number]  Date of Service: 02/27/2018    Pt is here for routine health maintenance examination  Chief Complaint  Patient presents with  . Annual Exam    4 month follow up  . Hypertension     The patient was seen in er in 12/2017. She was diagnosed and treated for shingles. Was given prednisone and acyclovir. She was also given a prescription for oxycodone, which she states she never filled. She was having significant right flank pain which was attributed to shingles. She continues to note an "orange" color to her urine. Sees it every time she urinates. Was told she had UTI in the ER back in April, however, she was not given antibiotics.   Hypertension  This is a chronic problem. The current episode started more than 1 year ago. The problem is unchanged. The problem is resistant. Associated symptoms include headaches, malaise/fatigue and palpitations. Pertinent negatives include no chest pain, neck pain or shortness of breath. There are no associated agents to hypertension. Risk factors for coronary artery disease include dyslipidemia, family history and post-menopausal state. Past treatments include calcium channel blockers and beta blockers. The current treatment provides moderate improvement. Compliance problems include psychosocial issues.     Current Medication: Outpatient Encounter Medications as of 02/27/2018  Medication Sig  . amLODipine (NORVASC) 2.5 MG tablet Take 5 mg by mouth daily.  Marland Kitchen amLODipine (NORVASC) 5 MG tablet TAKE 1 TABLET BY MOUTH AT BEDTIME FOR BLOOD PRESSURE  . bisoprolol (ZEBETA) 5 MG tablet Take 1 tablet (5 mg total) by mouth daily.  . Multiple Vitamins-Minerals (CENTRUM SILVER ADULT 50+ PO) Take by mouth daily.  Marland Kitchen oxyCODONE-acetaminophen (PERCOCET/ROXICET) 5-325 MG tablet Take 1 tablet by mouth every 4 (four)  hours as needed for severe pain.  Marland Kitchen triamcinolone (KENALOG) 0.025 % ointment Apply 1 application topically 2 (two) times daily as needed.  . diphenhydrAMINE (BENADRYL) 25 MG tablet Take 25 mg by mouth as needed.  . famciclovir (FAMVIR) 500 MG tablet Take 1 tablet (500 mg total) by mouth 3 (three) times daily. (Patient not taking: Reported on 02/27/2018)  . nitrofurantoin, macrocrystal-monohydrate, (MACROBID) 100 MG capsule Take 1 capsule (100 mg total) by mouth 2 (two) times daily.  Vladimir Faster Glycol-Propyl Glycol (SYSTANE OP) Apply to eye as needed.  . predniSONE (STERAPRED UNI-PAK 21 TAB) 10 MG (21) TBPK tablet Take 6 pills on day one then decrease by 1 pill each day (Patient not taking: Reported on 02/27/2018)   No facility-administered encounter medications on file as of 02/27/2018.     Surgical History: Past Surgical History:  Procedure Laterality Date  . ABDOMINAL HYSTERECTOMY    . CATARACT EXTRACTION W/PHACO Left 12/06/2016   Procedure: CATARACT EXTRACTION PHACO AND INTRAOCULAR LENS PLACEMENT (IOC);  Surgeon: Eulogio Bear, MD;  Location: [place];  Service: Ophthalmology;  Laterality: Left;  IVA Topical LEFT LATEX allergy  . CATARACT EXTRACTION W/PHACO Right 01/03/2017   Procedure: CATARACT EXTRACTION PHACO AND INTRAOCULAR LENS PLACEMENT ([place]) RIGHT;  Surgeon: Eulogio Bear, MD;  Location: [place];  Service: Ophthalmology;  Laterality: Right;  IVA TOPICAL latex sensitivity  . CYST EXCISION     multiple. in different areas  . FOOT SURGERY  1997, 2007  . TONSILLECTOMY      Medical History: Past Medical History:  Diagnosis Date  . Arthritis    lower  back  . Hypertension   . Motion sickness     Family History: History reviewed. No pertinent family history.    Review of Systems  Constitutional: Positive for malaise/fatigue. Negative for activity change, chills, fatigue and unexpected weight change.  HENT: Negative for congestion, postnasal  drip, rhinorrhea, sneezing and sore throat.   Eyes: Negative.  Negative for redness.  Respiratory: Negative for apnea, cough, chest tightness and shortness of breath.   Cardiovascular: Positive for palpitations. Negative for chest pain.  Gastrointestinal: Negative for abdominal pain, constipation, diarrhea, nausea and vomiting.  Endocrine: Negative for cold intolerance, heat intolerance, polydipsia, polyphagia and polyuria.  Genitourinary: Positive for dysuria and hematuria. Negative for frequency.  Musculoskeletal: Negative for arthralgias, back pain, joint swelling, myalgias and neck pain.  Skin: Positive for rash.       Scabbed rash along tailbone and upper part of right hip. Slightly red but not itchy.  Allergic/Immunologic: Negative for environmental allergies.  Neurological: Positive for headaches. Negative for dizziness, tremors and numbness.  Hematological: Negative for adenopathy. Does not bruise/bleed easily.  Psychiatric/Behavioral: Negative for agitation, behavioral problems (Depression), sleep disturbance and suicidal ideas. The patient is not nervous/anxious.      Today's Vitals   02/27/18 0905  BP: 135/66  Pulse: 60  Resp: 16  SpO2: 100%  Weight: 164 lb 12.8 oz (74.8 kg)  Height: 5\' 5"  (1.651 m)    Physical Exam  Constitutional: She is oriented to person, place, and time. She appears well-developed and well-nourished. No distress.  HENT:  Head: Normocephalic and atraumatic.  Mouth/Throat: Oropharynx is clear and moist. No oropharyngeal exudate.  Eyes: Pupils are equal, round, and reactive to light. Conjunctivae and EOM are normal.  Neck: Normal range of motion. Neck supple. No JVD present. Spinous process tenderness and muscular tenderness present. Carotid bruit is not present. No tracheal deviation present. No thyromegaly present.  Cardiovascular: Normal rate, regular rhythm, normal heart sounds and intact distal pulses. Exam reveals no gallop and no friction rub.   No murmur heard. Pulmonary/Chest: Effort normal and breath sounds normal. No respiratory distress. She has no wheezes. She has no rales. She exhibits no tenderness.  Abdominal: Soft. Bowel sounds are normal. There is no tenderness.  Genitourinary:  Genitourinary Comments: Urine sample is positive for protein and small blood.   Musculoskeletal: Normal range of motion.  Lymphadenopathy:    She has no cervical adenopathy.  Neurological: She is alert and oriented to person, place, and time. No cranial nerve deficit.  Skin: Skin is warm and dry. She is not diaphoretic.  Psychiatric: She has a normal mood and affect. Her behavior is normal. Judgment and thought content normal.  Nursing note and vitals reviewed.  LABS: Recent Results (from the past 2160 hour(s))  Urinalysis, Complete w Microscopic     Status: Abnormal   Collection Time: 01/04/18 10:44 AM  Result Value Ref Range   Color, Urine YELLOW (A) YELLOW   APPearance CLOUDY (A) CLEAR   Specific Gravity, Urine 1.008 1.005 - 1.030   pH 6.0 5.0 - 8.0   Glucose, UA NEGATIVE NEGATIVE mg/dL   Hgb urine dipstick SMALL (A) NEGATIVE   Bilirubin Urine NEGATIVE NEGATIVE   Ketones, ur NEGATIVE NEGATIVE mg/dL   Protein, ur NEGATIVE NEGATIVE mg/dL   Nitrite NEGATIVE NEGATIVE   Leukocytes, UA TRACE (A) NEGATIVE   RBC / HPF 0-5 0 - 5 RBC/hpf   WBC, UA 0-5 0 - 5 WBC/hpf   Bacteria, UA RARE (A) NONE SEEN  Squamous Epithelial / LPF 6-10 0 - 5    Comment: Please note change in reference range. Performed at [facility], [address] [place], [place] [address]   Urine Culture     Status: None   Collection Time: 01/04/18 10:44 AM  Result Value Ref Range   Specimen Description      URINE, RANDOM Performed at [facility], [address], [place], [place] [address]    Special Requests      Normal Performed at [facility] [place] [place], [place] [address]    Culture      NO GROWTH Performed at  [facility], [place] [address] [place], [place] [address]    Report Status 01/05/2018 FINAL   POCT Urinalysis Dipstick     Status: Abnormal   Collection Time: 02/27/18 10:12 AM  Result Value Ref Range   Color, UA     Clarity, UA     Glucose, UA Negative Negative   Bilirubin, UA small    Ketones, UA trace    Spec Grav, UA >=1.030 (A) 1.010 - 1.025   Blood, UA small    pH, UA 5.0 5.0 - 8.0   Protein, UA Positive (A) Negative    Comment:  Trace   Urobilinogen, UA  0.2 or 1.0 E.U./dL    Comment: small   Nitrite, UA      Comment: negative   Leukocytes, UA  Negative    Comment: negative    Appearance     Odor     Assessment/Plan: 1. Encounter for general adult medical examination with abnormal findings Annual health maintenance exam today. - Comprehensive metabolic panel; Future - T4, free; Future - TSH; Future - Lipid panel; Future  2. Urinary tract infection with hematuria, site unspecified Treat with macrobid 100mg  bid for 7 days. Send urine for culture and sensitivity and adjust antibiotics as indicated.  - nitrofurantoin, macrocrystal-monohydrate, (MACROBID) 100 MG capsule; Take 1 capsule (100 mg total) by mouth 2 (two) times daily.  Dispense: 14 capsule; Refill: 0 - CULTURE, URINE COMPREHENSIVE  3. Dysuria Treat for infection.  - Urinalysis, Routine w reflex microscopic - POCT Urinalysis Dipstick  4. Essential hypertension stable - CBC with Differential/Platelet; Future - Comprehensive metabolic panel; Future - T4, free; Future - TSH; Future - Lipid panel; Future  5. Vitamin D deficiency - Vitamin D 1,25 dihydroxy; Future  6. Screening for breast cancer - MM DIGITAL SCREENING BILATERAL; Future  General Counseling: Huntleigh verbalizes understanding of the findings of todays visit and agrees with plan of treatment. I have discussed any further diagnostic evaluation that may be needed or ordered today. We also reviewed her medications today. she has been  encouraged to call the office with any questions or concerns that should arise related to todays visit.    Counseling:  This patient was seen by Leretha Pol, FNP- C in Collaboration with Dr Lavera Guise as a part of collaborative care agreement  Orders Placed This Encounter  Procedures  . CULTURE, URINE COMPREHENSIVE  . MM DIGITAL SCREENING BILATERAL  . Urinalysis, Routine w reflex microscopic  . CBC with Differential/Platelet  . Comprehensive metabolic panel  . T4, free  . TSH  . Lipid panel  . Vitamin D 1,25 dihydroxy  . POCT Urinalysis Dipstick    Meds ordered this encounter  Medications  . nitrofurantoin, macrocrystal-monohydrate, (MACROBID) 100 MG capsule    Sig: Take 1 capsule (100 mg total) by mouth 2 (two)  times daily.    Dispense:  14 capsule    Refill:  0    Order Specific Question:   Supervising Provider    Answer:   Lavera Guise [[id number]]    Time spent: [place], MD  Internal Medicine

## 2018-02-28 LAB — COMPREHENSIVE METABOLIC PANEL
A/G RATIO: 1.7 (ref 1.2–2.2)
ALK PHOS: 65 IU/L (ref 39–117)
ALT: 10 IU/L (ref 0–32)
AST: 18 IU/L (ref 0–40)
Albumin: 4.4 g/dL (ref 3.5–4.8)
BILIRUBIN TOTAL: 0.5 mg/dL (ref 0.0–1.2)
BUN/Creatinine Ratio: 17 (ref 12–28)
BUN: 21 mg/dL (ref 8–27)
CALCIUM: 9.8 mg/dL (ref 8.7–10.3)
CHLORIDE: 103 mmol/L (ref 96–106)
CO2: 25 mmol/L (ref 20–29)
Creatinine, Ser: 1.23 mg/dL — ABNORMAL HIGH (ref 0.57–1.00)
GFR calc Af Amer: 51 mL/min/{1.73_m2} — ABNORMAL LOW (ref >59)
GFR, EST NON AFRICAN AMERICAN: 44 mL/min/{1.73_m2} — AB (ref >59)
Globulin, Total: 2.6 g/dL (ref 1.5–4.5)
Glucose: 103 mg/dL — ABNORMAL HIGH (ref 65–99)
POTASSIUM: 4.7 mmol/L (ref 3.5–5.2)
Sodium: 144 mmol/L (ref 134–144)
Total Protein: 7 g/dL (ref 6.0–8.5)

## 2018-02-28 LAB — CBC
HEMOGLOBIN: 13.6 g/dL (ref 11.1–15.9)
Hematocrit: 41.1 % (ref 34.0–46.6)
MCH: 30.8 pg (ref 26.6–33.0)
MCHC: 33.1 g/dL (ref 31.5–35.7)
MCV: 93 fL (ref 79–97)
Platelets: 271 10*3/uL (ref 150–450)
RBC: 4.42 x10E6/uL (ref 3.77–5.28)
RDW: 14.5 % (ref 12.3–15.4)
WBC: 6.1 10*3/uL (ref 3.4–10.8)

## 2018-02-28 LAB — URINALYSIS, ROUTINE W REFLEX MICROSCOPIC

## 2018-02-28 LAB — LIPID PANEL W/O CHOL/HDL RATIO
Cholesterol, Total: 211 mg/dL — ABNORMAL HIGH (ref 100–199)
HDL: 72 mg/dL (ref >39)
LDL CALC: 123 mg/dL — AB (ref 0–99)
TRIGLYCERIDES: 82 mg/dL (ref 0–149)
VLDL CHOLESTEROL CAL: 16 mg/dL (ref 5–40)

## 2018-02-28 LAB — VITAMIN D 25 HYDROXY (VIT D DEFICIENCY, FRACTURES): VIT D 25 HYDROXY: 31.7 ng/mL (ref 30.0–100.0)

## 2018-02-28 LAB — TSH: TSH: 1.8 u[IU]/mL (ref 0.450–4.500)

## 2018-02-28 LAB — T4, FREE: Free T4: 1.2 ng/dL (ref 0.82–1.77)

## 2018-02-28 LAB — SPECIMEN STATUS REPORT

## 2018-03-08 ENCOUNTER — Telehealth: Payer: Self-pay

## 2018-03-08 NOTE — Telephone Encounter (Signed)
-----   Message from Ronnell Freshwater, NP sent at 03/04/2018  7:04 PM EDT ----- Please send the patient out a note letting her know that labs were good. Thanks.

## 2018-03-08 NOTE — Telephone Encounter (Signed)
Pt advised result came back normal

## 2018-03-30 ENCOUNTER — Ambulatory Visit
Admission: RE | Admit: 2018-03-30 | Discharge: 2018-03-30 | Disposition: A | Discharge status: Home / Self Care | Payer: Medicare HMO | Source: Ambulatory Visit | Attending: Nurse Practitioner | Admitting: Nurse Practitioner

## 2018-03-30 DIAGNOSIS — Z1231 Encounter for screening mammogram for malignant neoplasm of breast: Secondary | ICD-10-CM | POA: Insufficient documentation

## 2018-03-30 DIAGNOSIS — Z1239 Encounter for other screening for malignant neoplasm of breast: Secondary | ICD-10-CM

## 2018-07-09 DIAGNOSIS — R69 Illness, unspecified: Secondary | ICD-10-CM | POA: Diagnosis not present

## 2018-08-20 DIAGNOSIS — R69 Illness, unspecified: Secondary | ICD-10-CM | POA: Diagnosis not present

## 2018-08-30 ENCOUNTER — Ambulatory Visit (INDEPENDENT_AMBULATORY_CARE_PROVIDER_SITE_OTHER): Payer: Medicare HMO | Admitting: Nurse Practitioner

## 2018-08-30 ENCOUNTER — Encounter: Payer: Self-pay | Admitting: Nurse Practitioner

## 2018-08-30 VITALS — BP 145/71 | HR 51 | Resp 16 | Ht 65.0 in | Wt 165.8 lb

## 2018-08-30 DIAGNOSIS — I10 Essential (primary) hypertension: Secondary | ICD-10-CM | POA: Diagnosis not present

## 2018-08-30 DIAGNOSIS — Z23 Encounter for immunization: Secondary | ICD-10-CM | POA: Diagnosis not present

## 2018-08-30 MED ORDER — PNEUMOCOCCAL VAC POLYVALENT 25 MCG/0.5ML IJ INJ: INJECTION | INTRAMUSCULAR | 0 refills | Status: DC

## 2018-08-30 NOTE — Progress Notes (Signed)
[facility] [place], [place] [address]  Internal MEDICINE  Office Visit Note  Patient Name: Lisa Thompson  [id number]  [id number]  Date of Service: 09/02/2018  Chief Complaint  Patient presents with  . Medical Management of Chronic Issues    6 month follow up  . Quality Metric Gaps    flu and pneumonia vaccine    The patient is here for follow up visit. Blood pressure is slightly elevated today. Slightly stressed about her mother. Talked to her mother on Sunday or Monday and blood pressure has been a little high since then.       Current Medication: Outpatient Encounter Medications as of 08/30/2018  Medication Sig  . amLODipine (NORVASC) 2.5 MG tablet Take 5 mg by mouth daily.  Marland Kitchen amLODipine (NORVASC) 5 MG tablet TAKE 1 TABLET BY MOUTH AT BEDTIME FOR BLOOD PRESSURE  . bisoprolol (ZEBETA) 5 MG tablet Take 1 tablet (5 mg total) by mouth daily.  . diphenhydrAMINE (BENADRYL) 25 MG tablet Take 25 mg by mouth as needed.  . Multiple Vitamins-Minerals (CENTRUM SILVER ADULT 50+ PO) Take by mouth daily.  Marland Kitchen oxyCODONE-acetaminophen (PERCOCET/ROXICET) 5-325 MG tablet Take 1 tablet by mouth every 4 (four) hours as needed for severe pain.  Vladimir Faster Glycol-Propyl Glycol (SYSTANE OP) Apply to eye as needed.  . triamcinolone (KENALOG) 0.025 % ointment Apply 1 application topically 2 (two) times daily as needed.  . famciclovir (FAMVIR) 500 MG tablet Take 1 tablet (500 mg total) by mouth 3 (three) times daily. (Patient not taking: Reported on 02/27/2018)  . nitrofurantoin, macrocrystal-monohydrate, (MACROBID) 100 MG capsule Take 1 capsule (100 mg total) by mouth 2 (two) times daily. (Patient not taking: Reported on 08/30/2018)  . pneumococcal 23 valent vaccine (PNEUMOVAX 23) 25 MCG/0.5ML injection Inject 0.27ml IM once  . predniSONE (STERAPRED UNI-PAK 21 TAB) 10 MG (21) TBPK tablet Take 6 pills on day one then decrease by 1 pill each day (Patient not taking: Reported on  02/27/2018)   No facility-administered encounter medications on file as of 08/30/2018.     Surgical History: Past Surgical History:  Procedure Laterality Date  . ABDOMINAL HYSTERECTOMY    . CATARACT EXTRACTION W/PHACO Left 12/06/2016   Procedure: CATARACT EXTRACTION PHACO AND INTRAOCULAR LENS PLACEMENT (IOC);  Surgeon: Eulogio Bear, MD;  Location: [place];  Service: Ophthalmology;  Laterality: Left;  IVA Topical LEFT LATEX allergy  . CATARACT EXTRACTION W/PHACO Right 01/03/2017   Procedure: CATARACT EXTRACTION PHACO AND INTRAOCULAR LENS PLACEMENT ([place]) RIGHT;  Surgeon: Eulogio Bear, MD;  Location: [place];  Service: Ophthalmology;  Laterality: Right;  IVA TOPICAL latex sensitivity  . CYST EXCISION     multiple. in different areas  . FOOT SURGERY  1997, 2007  . TONSILLECTOMY      Medical History: Past Medical History:  Diagnosis Date  . Arthritis    lower back  . Hypertension   . Motion sickness     Family History: Family History  Problem Relation Age of Onset  . Breast cancer Neg Hx     Social History   Socioeconomic History  . Marital status: Single    Spouse name: Not on file  . Number of children: Not on file  . Years of education: Not on file  . Highest education level: Not on file  Occupational History  . Not on file  Social Needs  . Financial resource strain: Not on file  . Food insecurity:    Worry: Not  on file    Inability: Not on file  . Transportation needs:    Medical: Not on file    Non-medical: Not on file  Tobacco Use  . Smoking status: Never Smoker  . Smokeless tobacco: Never Used  Substance and Sexual Activity  . Alcohol use: Yes    Comment: rare - holidays  . Drug use: Never  . Sexual activity: Not on file  Lifestyle  . Physical activity:    Days per week: Not on file    Minutes per session: Not on file  . Stress: Not on file  Relationships  . Social connections:    Talks on phone: Not on file     Gets together: Not on file    Attends religious service: Not on file    Active member of club or organization: Not on file    Attends meetings of clubs or organizations: Not on file    Relationship status: Not on file  . Intimate partner violence:    Fear of current or ex partner: Not on file    Emotionally abused: Not on file    Physically abused: Not on file    Forced sexual activity: Not on file  Other Topics Concern  . Not on file  Social History Narrative  . Not on file      Review of Systems  Constitutional: Negative for activity change, chills, fatigue and unexpected weight change.  HENT: Negative for congestion, postnasal drip, rhinorrhea, sneezing and sore throat.   Eyes: Negative.  Negative for redness.  Respiratory: Negative for apnea, cough, chest tightness and shortness of breath.   Cardiovascular: Negative for chest pain and palpitations.  Gastrointestinal: Negative for abdominal pain, constipation, diarrhea, nausea and vomiting.  Endocrine: Negative for heat intolerance, polydipsia and polyuria.  Genitourinary: Negative for dysuria and frequency.  Musculoskeletal: Positive for arthralgias, myalgias and neck pain. Negative for back pain and joint swelling.  Skin: Negative for rash.  Allergic/Immunologic: Negative for environmental allergies.  Neurological: Positive for headaches. Negative for tremors and numbness.  Hematological: Negative for adenopathy. Does not bruise/bleed easily.  Psychiatric/Behavioral: Negative for agitation, behavioral problems (Depression), sleep disturbance and suicidal ideas. The patient is not nervous/anxious.     Today's Vitals   08/30/18 1132  BP: (!) 145/71  Pulse: (!) 51  Resp: 16  SpO2: 100%  Weight: 165 lb 12.8 oz (75.2 kg)  Height: 5\' 5"  (1.651 m)    Physical Exam Vitals signs and nursing note reviewed.  Constitutional:      General: She is not in acute distress.    Appearance: Normal appearance. She is well-developed.  She is not diaphoretic.  HENT:     Head: Normocephalic and atraumatic.     Nose: Nose normal.     Mouth/Throat:     Pharynx: No oropharyngeal exudate.  Eyes:     Extraocular Movements: Extraocular movements intact.     Pupils: Pupils are equal, round, and reactive to light.  Neck:     Musculoskeletal: Neck supple. Decreased range of motion. Spinous process tenderness present. No muscular tenderness.     Thyroid: No thyromegaly.     Vascular: No carotid bruit or JVD.     Trachea: No tracheal deviation.  Cardiovascular:     Rate and Rhythm: Normal rate and regular rhythm.     Heart sounds: Normal heart sounds. No murmur. No friction rub. No gallop.   Pulmonary:     Effort: Pulmonary effort is normal. No respiratory distress.  Breath sounds: Normal breath sounds. No wheezing or rales.  Chest:     Chest wall: No tenderness.  Abdominal:     General: Bowel sounds are normal.     Palpations: Abdomen is soft.     Tenderness: There is no abdominal tenderness.  Lymphadenopathy:     Cervical: No cervical adenopathy.  Skin:    General: Skin is warm and dry.     Capillary Refill: Capillary refill takes less than 2 seconds.  Neurological:     General: No focal deficit present.     Mental Status: She is alert and oriented to person, place, and time.     Cranial Nerves: No cranial nerve deficit.  Psychiatric:        Behavior: Behavior normal.        Thought Content: Thought content normal.        Judgment: Judgment normal.    Assessment/Plan: 1. Essential hypertension Blood pressure stable. Continue BP medications as prescribed.   2. Need for prophylactic vaccination with Streptococcus pneumoniae (Pneumococcus) and Influenza vaccines Prescription for pneumovax sent to her pharmacy for administration.  - pneumococcal 23 valent vaccine (PNEUMOVAX 23) 25 MCG/0.5ML injection; Inject 0.43ml IM once  Dispense: 0.5 mL; Refill: 0  General Counseling: Laynee verbalizes understanding of  the findings of todays visit and agrees with plan of treatment. I have discussed any further diagnostic evaluation that may be needed or ordered today. We also reviewed her medications today. she has been encouraged to call the office with any questions or concerns that should arise related to todays visit.  Hypertension Counseling:   The following hypertensive lifestyle modification were recommended and discussed:  1. Limiting alcohol intake to less than 1 oz/day of ethanol:(24 oz of beer or 8 oz of wine or 2 oz of 100-proof whiskey). 2. Take baby ASA 81 mg daily. 3. Importance of regular aerobic exercise and losing weight. 4. Reduce dietary saturated fat and cholesterol intake for overall cardiovascular health. 5. Maintaining adequate dietary potassium, calcium, and magnesium intake. 6. Regular monitoring of the blood pressure. 7. Reduce sodium intake to less than 100 mmol/day (less than 2.3 gm of sodium or less than 6 gm of sodium choride)   This patient was seen by [place] with Dr Lavera Guise as a part of collaborative care agreement  Meds ordered this encounter  Medications  . pneumococcal 23 valent vaccine (PNEUMOVAX 23) 25 MCG/0.5ML injection    Sig: Inject 0.67ml IM once    Dispense:  0.5 mL    Refill:  0    Order Specific Question:   Supervising Provider    Answer:   Lavera Guise [[id number]]    Time spent: 37 Minutes      Dr Lavera Guise Internal medicine

## 2018-12-06 ENCOUNTER — Other Ambulatory Visit: Payer: Self-pay

## 2018-12-06 MED ORDER — BISOPROLOL FUMARATE 5 MG PO TABS: 5.0000 mg | ORAL_TABLET | Freq: Every day | ORAL | 1 refills | Status: DC

## 2018-12-06 MED ORDER — AMLODIPINE BESYLATE 5 MG PO TABS: ORAL_TABLET | ORAL | 1 refills | Status: DC

## 2019-03-01 ENCOUNTER — Ambulatory Visit (INDEPENDENT_AMBULATORY_CARE_PROVIDER_SITE_OTHER): Payer: Medicare HMO | Admitting: Nurse Practitioner

## 2019-03-01 ENCOUNTER — Other Ambulatory Visit: Payer: Self-pay

## 2019-03-01 ENCOUNTER — Encounter: Payer: Self-pay | Admitting: Nurse Practitioner

## 2019-03-01 VITALS — BP 124/82 | HR 60 | Resp 16 | Ht 65.0 in | Wt 172.0 lb

## 2019-03-01 DIAGNOSIS — I10 Essential (primary) hypertension: Secondary | ICD-10-CM | POA: Diagnosis not present

## 2019-03-01 DIAGNOSIS — Z1239 Encounter for other screening for malignant neoplasm of breast: Secondary | ICD-10-CM | POA: Diagnosis not present

## 2019-03-01 DIAGNOSIS — Z0001 Encounter for general adult medical examination with abnormal findings: Secondary | ICD-10-CM

## 2019-03-01 DIAGNOSIS — L308 Other specified dermatitis: Secondary | ICD-10-CM

## 2019-03-01 DIAGNOSIS — L299 Pruritus, unspecified: Secondary | ICD-10-CM

## 2019-03-01 DIAGNOSIS — R300 Dysuria: Secondary | ICD-10-CM

## 2019-03-01 MED ORDER — AMLODIPINE BESYLATE 5 MG PO TABS: ORAL_TABLET | ORAL | 1 refills | Status: DC

## 2019-03-01 MED ORDER — BISOPROLOL FUMARATE 5 MG PO TABS: 5.0000 mg | ORAL_TABLET | Freq: Every day | ORAL | 1 refills | Status: DC

## 2019-03-01 NOTE — Progress Notes (Signed)
[facility] [place], [place] [address]  Internal MEDICINE  Office Visit Note  Patient Name: Lisa Thompson  [id number]  [id number]  Date of Service: 03/03/2019   Pt is here for routine health maintenance examination  Chief Complaint  Patient presents with  . Annual Exam    medicare annual exam   . Hypertension  . Pruritis    waking up in middle of night just itching   . Weight Gain    would like to discuss possible weight loss      The patient is  Here for health maintenance exam. She states that she has been itching a lot, especially at night. She states that she has not changed detergents or lotion. Does not bother her during the day. She has not tried taking benadryl or other anti-histamine to help this. She also is concerned about weight gain. Has not been exercising or eating like she should over the past few months. Her husband has been ill, and she has been taking care of him and not walking or exercising as much as she used to. Also admits to making some wrong diet choices recently. She states that she has moderate lower back pain. She wants to get a back brace to help support muscles in the back. She does have history of degenerative disc disease throughout the spine. This is causing her increased pain, especially with taking care of her husband.      Current Medication: Outpatient Encounter Medications as of 03/01/2019  Medication Sig  . amLODipine (NORVASC) 5 MG tablet TAKE 1 TABLET BY MOUTH AT BEDTIME FOR BLOOD PRESSURE  . bisoprolol (ZEBETA) 5 MG tablet Take 1 tablet (5 mg total) by mouth daily.  . Multiple Vitamins-Minerals (CENTRUM SILVER ADULT 50+ PO) Take by mouth daily.  Vladimir Faster Glycol-Propyl Glycol (SYSTANE OP) Apply to eye as needed.  . triamcinolone (KENALOG) 0.025 % ointment Apply 1 application topically 2 (two) times daily as needed.  . [DISCONTINUED] amLODipine (NORVASC) 5 MG tablet TAKE 1 TABLET BY MOUTH AT BEDTIME FOR BLOOD  PRESSURE  . [DISCONTINUED] bisoprolol (ZEBETA) 5 MG tablet Take 1 tablet (5 mg total) by mouth daily.  . pneumococcal 23 valent vaccine (PNEUMOVAX 23) 25 MCG/0.5ML injection Inject 0.39ml IM once  . [DISCONTINUED] amLODipine (NORVASC) 2.5 MG tablet Take 5 mg by mouth daily.  . [DISCONTINUED] diphenhydrAMINE (BENADRYL) 25 MG tablet Take 25 mg by mouth as needed.  . [DISCONTINUED] famciclovir (FAMVIR) 500 MG tablet Take 1 tablet (500 mg total) by mouth 3 (three) times daily. (Patient not taking: Reported on 02/27/2018)  . [DISCONTINUED] nitrofurantoin, macrocrystal-monohydrate, (MACROBID) 100 MG capsule Take 1 capsule (100 mg total) by mouth 2 (two) times daily. (Patient not taking: Reported on 08/30/2018)  . [DISCONTINUED] oxyCODONE-acetaminophen (PERCOCET/ROXICET) 5-325 MG tablet Take 1 tablet by mouth every 4 (four) hours as needed for severe pain. (Patient not taking: Reported on 03/01/2019)  . [DISCONTINUED] predniSONE (STERAPRED UNI-PAK 21 TAB) 10 MG (21) TBPK tablet Take 6 pills on day one then decrease by 1 pill each day (Patient not taking: Reported on 02/27/2018)   No facility-administered encounter medications on file as of 03/01/2019.     Surgical History: Past Surgical History:  Procedure Laterality Date  . ABDOMINAL HYSTERECTOMY    . CATARACT EXTRACTION W/PHACO Left 12/06/2016   Procedure: CATARACT EXTRACTION PHACO AND INTRAOCULAR LENS PLACEMENT (IOC);  Surgeon: Eulogio Bear, MD;  Location: [place];  Service: Ophthalmology;  Laterality: Left;  IVA Topical LEFT  LATEX allergy  . CATARACT EXTRACTION W/PHACO Right 01/03/2017   Procedure: CATARACT EXTRACTION PHACO AND INTRAOCULAR LENS PLACEMENT ([place]) RIGHT;  Surgeon: Eulogio Bear, MD;  Location: [place];  Service: Ophthalmology;  Laterality: Right;  IVA TOPICAL latex sensitivity  . CYST EXCISION     multiple. in different areas  . FOOT SURGERY  1997, 2007  . TONSILLECTOMY      Medical History: Past  Medical History:  Diagnosis Date  . Arthritis    lower back  . Hypertension   . Motion sickness     Family History: Family History  Problem Relation Age of Onset  . Breast cancer Neg Hx       Review of Systems  Constitutional: Negative for activity change, chills, fatigue and unexpected weight change.  HENT: Negative for congestion, postnasal drip, rhinorrhea, sneezing and sore throat.   Respiratory: Negative for apnea, cough, chest tightness and shortness of breath.   Cardiovascular: Negative for chest pain and palpitations.  Gastrointestinal: Negative for abdominal pain, constipation, diarrhea, nausea and vomiting.  Endocrine: Negative for heat intolerance, polydipsia and polyuria.  Musculoskeletal: Negative for arthralgias, back pain, joint swelling, myalgias and neck pain.  Skin: Positive for rash.       Generalized itchiness  Allergic/Immunologic: Negative for environmental allergies.  Neurological: Positive for headaches. Negative for tremors and numbness.  Hematological: Negative for adenopathy. Does not bruise/bleed easily.  Psychiatric/Behavioral: Negative for agitation, behavioral problems (Depression), sleep disturbance and suicidal ideas. The patient is not nervous/anxious.      Today's Vitals   03/01/19 0904  BP: 124/82  Pulse: 60  Resp: 16  SpO2: 99%  Weight: 172 lb (78 kg)  Height: 5\' 5"  (1.651 m)   Body mass index is 28.62 kg/m.  Physical Exam Vitals signs and nursing note reviewed.  Constitutional:      General: She is not in acute distress.    Appearance: Normal appearance. She is well-developed. She is not diaphoretic.  HENT:     Head: Normocephalic and atraumatic.     Nose: Nose normal.     Mouth/Throat:     Pharynx: No oropharyngeal exudate.  Eyes:     Extraocular Movements: Extraocular movements intact.     Pupils: Pupils are equal, round, and reactive to light.  Neck:     Musculoskeletal: Normal range of motion and neck supple. No  spinous process tenderness or muscular tenderness.     Thyroid: No thyromegaly or thyroid tenderness.     Vascular: No carotid bruit or JVD.     Trachea: No tracheal deviation.  Cardiovascular:     Rate and Rhythm: Normal rate and regular rhythm.     Pulses: Normal pulses.     Heart sounds: Normal heart sounds. No murmur. No friction rub. No gallop.   Pulmonary:     Effort: Pulmonary effort is normal. No respiratory distress.     Breath sounds: Normal breath sounds. No wheezing or rales.  Chest:     Chest wall: No tenderness.     Breasts:        Right: Normal. No swelling, bleeding, inverted nipple, mass, nipple discharge, skin change or tenderness.        Left: Normal. No swelling, bleeding, inverted nipple, mass, nipple discharge, skin change or tenderness.  Abdominal:     General: Bowel sounds are normal.     Palpations: Abdomen is soft.     Tenderness: There is no abdominal tenderness.  Lymphadenopathy:     Cervical:  No cervical adenopathy.  Skin:    General: Skin is warm and dry.     Capillary Refill: Capillary refill takes less than 2 seconds.  Neurological:     General: No focal deficit present.     Mental Status: She is alert and oriented to person, place, and time.     Cranial Nerves: No cranial nerve deficit.  Psychiatric:        Behavior: Behavior normal.        Thought Content: Thought content normal.        Judgment: Judgment normal.   . Depression screen [facility] 2/9 03/01/2019 08/30/2018 02/27/2018 10/30/2017  Decreased Interest 0 0 0 0  Down, Depressed, Hopeless 0 0 0 0  PHQ - 2 Score 0 0 0 0    Functional Status Survey: Is the patient deaf or have difficulty hearing?: No Does the patient have difficulty seeing, even when wearing glasses/contacts?: No Does the patient have difficulty concentrating, remembering, or making decisions?: No Does the patient have difficulty walking or climbing stairs?: No Does the patient have difficulty dressing or bathing?: No Does  the patient have difficulty doing errands alone such as visiting a doctor's office or shopping?: No  MMSE - Mini Mental State Exam 03/01/2019 02/27/2018  Orientation to time 5 5  Orientation to Place 5 5  Registration 3 3  Attention/ Calculation 5 5  Recall 3 3  Language- name 2 objects 2 2  Language- repeat 1 1  Language- follow 3 step command 3 3  Language- read & follow direction 1 1  Write a sentence 1 1  Copy design 1 1  Total score 30 30    Fall Risk  03/01/2019 08/30/2018 02/27/2018 10/30/2017  Falls in the past year? 0 0 No No  Number falls in past yr: 0 - - -      LABS: Recent Results (from the past 2160 hour(s))  UA/M w/rflx Culture, Routine     Status: Abnormal (Preliminary result)   Collection Time: 03/01/19  9:06 AM   Specimen: Urine   URINE  Result Value Ref Range   Specific Gravity, UA 1.021 1.005 - 1.030   pH, UA 5.0 5.0 - 7.5   Color, UA Yellow Yellow   Appearance Ur Cloudy (A) Clear   Leukocytes,UA Trace (A) Negative   Protein,UA Negative Negative/Trace   Glucose, UA Negative Negative   Ketones, UA Trace (A) Negative   RBC, UA Negative Negative   Bilirubin, UA Negative Negative   Urobilinogen, Ur 0.2 0.2 - 1.0 mg/dL   Nitrite, UA Negative Negative   Microscopic Examination See below:     Comment: Microscopic was indicated and was performed.   Urinalysis Reflex Comment     Comment: This specimen has reflexed to a Urine Culture.  Microscopic Examination     Status: Abnormal   Collection Time: 03/01/19  9:06 AM   URINE  Result Value Ref Range   WBC, UA 11-30 (A) 0 - 5 /hpf   RBC 3-10 (A) 0 - 2 /hpf   Epithelial Cells (non renal) >10 (A) 0 - 10 /hpf   Casts Present (A) None seen /lpf   Cast Type Hyaline casts N/A   Crystals Present (A) N/A   Crystal Type Amorphous Sediment N/A    Comment: Calcium Oxalate   Mucus, UA Present Not Estab.   Bacteria, UA None seen None seen/Few  Urine Culture, Reflex     Status: None (Preliminary result)   Collection  Time: 03/01/19  9:06 AM   URINE  Result Value Ref Range   Urine Culture, Routine Preliminary report    Organism ID, Bacteria Comment     Comment: Specimen has been received and testing has been initiated.   Assessment/Plan: 1. Encounter for general adult medical examination with abnormal findings Annual health maintenance exam today.  2. Essential hypertension Stable. Continue bo medication as prescribed  - bisoprolol (ZEBETA) 5 MG tablet; Take 1 tablet (5 mg total) by mouth daily.  Dispense: 90 tablet; Refill: 1 - amLODipine (NORVASC) 5 MG tablet; TAKE 1 TABLET BY MOUTH AT BEDTIME FOR BLOOD PRESSURE  Dispense: 90 tablet; Refill: 1  3. Pruritic dermatitis Use triamcinolone cream as needed and as prescribed.   4. Screening for breast cancer - MM DIGITAL SCREENING BILATERAL; Future  5. Dysuria - UA/M w/rflx Culture, Routine  General Counseling: Gillian verbalizes understanding of the findings of todays visit and agrees with plan of treatment. I have discussed any further diagnostic evaluation that may be needed or ordered today. We also reviewed her medications today. she has been encouraged to call the office with any questions or concerns that should arise related to todays visit.    Counseling:  Hypertension Counseling:   The following hypertensive lifestyle modification were recommended and discussed:  1. Limiting alcohol intake to less than 1 oz/day of ethanol:(24 oz of beer or 8 oz of wine or 2 oz of 100-proof whiskey). 2. Take baby ASA 81 mg daily. 3. Importance of regular aerobic exercise and losing weight. 4. Reduce dietary saturated fat and cholesterol intake for overall cardiovascular health. 5. Maintaining adequate dietary potassium, calcium, and magnesium intake. 6. Regular monitoring of the blood pressure. 7. Reduce sodium intake to less than 100 mmol/day (less than 2.3 gm of sodium or less than 6 gm of sodium choride)   This patient was seen by Garibaldi with Dr Lavera Guise as a part of collaborative care agreement  Orders Placed This Encounter  Procedures  . Microscopic Examination  . Urine Culture, Reflex  . MM DIGITAL SCREENING BILATERAL  . UA/M w/rflx Culture, Routine    Meds ordered this encounter  Medications  . bisoprolol (ZEBETA) 5 MG tablet    Sig: Take 1 tablet (5 mg total) by mouth daily.    Dispense:  90 tablet    Refill:  1    Order Specific Question:   Supervising Provider    Answer:   Lavera Guise [[id number]]  . amLODipine (NORVASC) 5 MG tablet    Sig: TAKE 1 TABLET BY MOUTH AT BEDTIME FOR BLOOD PRESSURE    Dispense:  90 tablet    Refill:  1    Order Specific Question:   Supervising Provider    Answer:   Lavera Guise [[id number]]    Time spent: [place], MD  Internal Medicine

## 2019-03-03 DIAGNOSIS — M5136 Other intervertebral disc degeneration, lumbar region: Secondary | ICD-10-CM | POA: Insufficient documentation

## 2019-03-03 DIAGNOSIS — R69 Illness, unspecified: Secondary | ICD-10-CM | POA: Diagnosis not present

## 2019-03-03 DIAGNOSIS — L299 Pruritus, unspecified: Secondary | ICD-10-CM | POA: Insufficient documentation

## 2019-03-04 LAB — MICROSCOPIC EXAMINATION
Bacteria, UA: NONE SEEN
Epithelial Cells (non renal): >10 /hpf — AB (ref 0–10)

## 2019-03-04 LAB — UA/M W/RFLX CULTURE, ROUTINE
Bilirubin, UA: NEGATIVE
Glucose, UA: NEGATIVE
Nitrite, UA: NEGATIVE
Protein,UA: NEGATIVE
RBC, UA: NEGATIVE
Specific Gravity, UA: 1.021 (ref 1.005–1.030)
Urobilinogen, Ur: 0.2 mg/dL (ref 0.2–1.0)
pH, UA: 5 (ref 5.0–7.5)

## 2019-03-04 LAB — URINE CULTURE, REFLEX

## 2019-03-05 ENCOUNTER — Other Ambulatory Visit: Payer: Self-pay | Admitting: Nurse Practitioner

## 2019-03-05 DIAGNOSIS — Z0001 Encounter for general adult medical examination with abnormal findings: Secondary | ICD-10-CM | POA: Diagnosis not present

## 2019-03-05 DIAGNOSIS — E559 Vitamin D deficiency, unspecified: Secondary | ICD-10-CM | POA: Diagnosis not present

## 2019-03-05 DIAGNOSIS — I10 Essential (primary) hypertension: Secondary | ICD-10-CM | POA: Diagnosis not present

## 2019-03-06 LAB — CBC
Hematocrit: 40.1 % (ref 34.0–46.6)
Hemoglobin: 13.7 g/dL (ref 11.1–15.9)
MCH: 31.6 pg (ref 26.6–33.0)
MCHC: 34.2 g/dL (ref 31.5–35.7)
MCV: 93 fL (ref 79–97)
Platelets: 256 x10E3/uL (ref 150–450)
RBC: 4.33 x10E6/uL (ref 3.77–5.28)
RDW: 14 % (ref 11.7–15.4)
WBC: 5.1 x10E3/uL (ref 3.4–10.8)

## 2019-03-06 LAB — COMPREHENSIVE METABOLIC PANEL
ALT: 12 IU/L (ref 0–32)
AST: 20 IU/L (ref 0–40)
Albumin/Globulin Ratio: 1.4 (ref 1.2–2.2)
Albumin: 4.3 g/dL (ref 3.7–4.7)
Alkaline Phosphatase: 71 IU/L (ref 39–117)
BUN/Creatinine Ratio: 14 (ref 12–28)
BUN: 16 mg/dL (ref 8–27)
Bilirubin Total: 0.4 mg/dL (ref 0.0–1.2)
CO2: 26 mmol/L (ref 20–29)
Calcium: 9.4 mg/dL (ref 8.7–10.3)
Chloride: 103 mmol/L (ref 96–106)
Creatinine, Ser: 1.17 mg/dL — ABNORMAL HIGH (ref 0.57–1.00)
GFR calc Af Amer: 54 mL/min/{1.73_m2} — ABNORMAL LOW (ref >59)
GFR calc non Af Amer: 47 mL/min/{1.73_m2} — ABNORMAL LOW (ref >59)
Globulin, Total: 3 g/dL (ref 1.5–4.5)
Glucose: 100 mg/dL — ABNORMAL HIGH (ref 65–99)
Potassium: 4.8 mmol/L (ref 3.5–5.2)
Sodium: 143 mmol/L (ref 134–144)
Total Protein: 7.3 g/dL (ref 6.0–8.5)

## 2019-03-06 LAB — LIPID PANEL W/O CHOL/HDL RATIO
Cholesterol, Total: 214 mg/dL — ABNORMAL HIGH (ref 100–199)
HDL: 66 mg/dL (ref >39)
LDL Calculated: 128 mg/dL — ABNORMAL HIGH (ref 0–99)
Triglycerides: 102 mg/dL (ref 0–149)
VLDL Cholesterol Cal: 20 mg/dL (ref 5–40)

## 2019-03-06 LAB — VITAMIN D 25 HYDROXY (VIT D DEFICIENCY, FRACTURES): Vit D, 25-Hydroxy: 31.3 ng/mL (ref 30.0–100.0)

## 2019-03-06 LAB — T4, FREE: Free T4: 1.18 ng/dL (ref 0.82–1.77)

## 2019-03-06 LAB — T3: T3, Total: 88 ng/dL (ref 71–180)

## 2019-03-06 LAB — TSH: TSH: 1.84 u[IU]/mL (ref 0.450–4.500)

## 2019-04-12 ENCOUNTER — Other Ambulatory Visit: Payer: Self-pay

## 2019-04-12 ENCOUNTER — Ambulatory Visit
Admission: RE | Admit: 2019-04-12 | Discharge: 2019-04-12 | Disposition: A | Discharge status: Home / Self Care | Payer: Medicare HMO | Source: Ambulatory Visit | Attending: Nurse Practitioner | Admitting: Nurse Practitioner

## 2019-04-12 DIAGNOSIS — Z1231 Encounter for screening mammogram for malignant neoplasm of breast: Secondary | ICD-10-CM | POA: Diagnosis not present

## 2019-04-12 DIAGNOSIS — Z1239 Encounter for other screening for malignant neoplasm of breast: Secondary | ICD-10-CM

## 2019-04-26 ENCOUNTER — Other Ambulatory Visit: Payer: Self-pay

## 2019-04-26 ENCOUNTER — Ambulatory Visit (INDEPENDENT_AMBULATORY_CARE_PROVIDER_SITE_OTHER): Payer: Medicare HMO | Admitting: Nurse Practitioner

## 2019-04-26 ENCOUNTER — Encounter: Payer: Self-pay | Admitting: Nurse Practitioner

## 2019-04-26 VITALS — BP 142/69 | HR 64 | Temp 96.5°F | Resp 16 | Ht 65.0 in | Wt 172.4 lb

## 2019-04-26 DIAGNOSIS — I10 Essential (primary) hypertension: Secondary | ICD-10-CM | POA: Diagnosis not present

## 2019-04-26 DIAGNOSIS — E782 Mixed hyperlipidemia: Secondary | ICD-10-CM | POA: Insufficient documentation

## 2019-04-26 DIAGNOSIS — N289 Disorder of kidney and ureter, unspecified: Secondary | ICD-10-CM | POA: Insufficient documentation

## 2019-04-26 NOTE — Progress Notes (Signed)
[facility] [place], [place] [address]  Internal MEDICINE  Office Visit Note  Patient Name: Lisa Thompson  [id number]  [id number]  Date of Service: 04/26/2019  Chief Complaint  Patient presents with  . Follow-up    labs  . Hypertension    The patient is here for follow up of labs. Cholesterol was mildly elevated. LDL was 128 and total cholesterol was 214. Her triglyceride level and HDL were normal. She states that she has been eating foods slightly higher in fat than she would normally eat. She is also not exercising as much as normal. Her renal functions were also elevated. Though improved from last check, this has been trend in her labs over the past few years. She has no concenrs or complaints today.       Current Medication: Outpatient Encounter Medications as of 04/26/2019  Medication Sig  . amLODipine (NORVASC) 5 MG tablet TAKE 1 TABLET BY MOUTH AT BEDTIME FOR BLOOD PRESSURE  . bisoprolol (ZEBETA) 5 MG tablet Take 1 tablet (5 mg total) by mouth daily.  . diphenhydrAMINE HCl (BENADRYL ALLERGY PO) Take by mouth.  . Multiple Vitamins-Minerals (CENTRUM SILVER ADULT 50+ PO) Take by mouth daily.  . pneumococcal 23 valent vaccine (PNEUMOVAX 23) 25 MCG/0.5ML injection Inject 0.70ml IM once  . Polyethyl Glycol-Propyl Glycol (SYSTANE OP) Apply to eye as needed.  . [DISCONTINUED] triamcinolone (KENALOG) 0.025 % ointment Apply 1 application topically 2 (two) times daily as needed.   No facility-administered encounter medications on file as of 04/26/2019.     Surgical History: Past Surgical History:  Procedure Laterality Date  . ABDOMINAL HYSTERECTOMY    . CATARACT EXTRACTION W/PHACO Left 12/06/2016   Procedure: CATARACT EXTRACTION PHACO AND INTRAOCULAR LENS PLACEMENT (IOC);  Surgeon: Eulogio Bear, MD;  Location: [facility];  Service: Ophthalmology;  Laterality: Left;  IVA Topical LEFT LATEX allergy  . CATARACT EXTRACTION W/PHACO Right  01/03/2017   Procedure: CATARACT EXTRACTION PHACO AND INTRAOCULAR LENS PLACEMENT ([place]) RIGHT;  Surgeon: Eulogio Bear, MD;  Location: [facility];  Service: Ophthalmology;  Laterality: Right;  IVA TOPICAL latex sensitivity  . CYST EXCISION     multiple. in different areas  . FOOT SURGERY  1997, 2007  . TONSILLECTOMY      Medical History: Past Medical History:  Diagnosis Date  . Arthritis    lower back  . Hypertension   . Motion sickness     Family History: Family History  Problem Relation Age of Onset  . Breast cancer Neg Hx     Social History   Socioeconomic History  . Marital status: Single    Spouse name: Not on file  . Number of children: Not on file  . Years of education: Not on file  . Highest education level: Not on file  Occupational History  . Not on file  Social Needs  . Financial resource strain: Not on file  . Food insecurity    Worry: Not on file    Inability: Not on file  . Transportation needs    Medical: Not on file    Non-medical: Not on file  Tobacco Use  . Smoking status: Never Smoker  . Smokeless tobacco: Never Used  Substance and Sexual Activity  . Alcohol use: Not Currently  . Drug use: Never  . Sexual activity: Not on file  Lifestyle  . Physical activity    Days per week: Not on file    Minutes per session: Not on  file  . Stress: Not on file  Relationships  . Social Herbalist on phone: Not on file    Gets together: Not on file    Attends religious service: Not on file    Active member of club or organization: Not on file    Attends meetings of clubs or organizations: Not on file    Relationship status: Not on file  . Intimate partner violence    Fear of current or ex partner: Not on file    Emotionally abused: Not on file    Physically abused: Not on file    Forced sexual activity: Not on file  Other Topics Concern  . Not on file  Social History Narrative  . Not on file      Review of Systems   Constitutional: Negative for activity change, chills, fatigue and unexpected weight change.  HENT: Negative for congestion, postnasal drip, rhinorrhea, sneezing and sore throat.   Respiratory: Negative for apnea, cough, chest tightness and shortness of breath.   Cardiovascular: Negative for chest pain and palpitations.  Gastrointestinal: Negative for abdominal pain, constipation, diarrhea, nausea and vomiting.  Endocrine: Negative for cold intolerance, heat intolerance, polydipsia and polyuria.  Musculoskeletal: Negative for arthralgias, back pain, joint swelling, myalgias and neck pain.  Skin: Positive for rash.       Generalized itchiness. Improves when she takes benadryl at night.   Allergic/Immunologic: Negative for environmental allergies.  Neurological: Positive for headaches. Negative for tremors and numbness.  Hematological: Negative for adenopathy. Does not bruise/bleed easily.  Psychiatric/Behavioral: Negative for agitation, behavioral problems (Depression), sleep disturbance and suicidal ideas. The patient is not nervous/anxious.     Today's Vitals   04/26/19 0948  BP: (!) 142/69  Pulse: 64  Resp: 16  Temp: (!) 96.5 F (35.8 C)  SpO2: 97%  Weight: 172 lb 6.4 oz (78.2 kg)  Height: 5\' 5"  (1.651 m)   Body mass index is 28.69 kg/m.  Physical Exam Vitals signs and nursing note reviewed.  Constitutional:      General: She is not in acute distress.    Appearance: Normal appearance. She is well-developed. She is not diaphoretic.  HENT:     Head: Normocephalic and atraumatic.     Mouth/Throat:     Pharynx: No oropharyngeal exudate.  Eyes:     Pupils: Pupils are equal, round, and reactive to light.  Neck:     Musculoskeletal: Normal range of motion and neck supple.     Thyroid: No thyromegaly.     Vascular: No JVD.     Trachea: No tracheal deviation.  Cardiovascular:     Rate and Rhythm: Normal rate and regular rhythm.     Heart sounds: Normal heart sounds. No  murmur. No friction rub. No gallop.   Pulmonary:     Effort: Pulmonary effort is normal. No respiratory distress.     Breath sounds: Normal breath sounds. No wheezing or rales.  Chest:     Chest wall: No tenderness.  Abdominal:     General: Bowel sounds are normal.     Palpations: Abdomen is soft.  Musculoskeletal: Normal range of motion.  Lymphadenopathy:     Cervical: No cervical adenopathy.  Skin:    General: Skin is warm and dry.  Neurological:     Mental Status: She is alert and oriented to person, place, and time.     Cranial Nerves: No cranial nerve deficit.  Psychiatric:        Behavior:  Behavior normal.        Thought Content: Thought content normal.        Judgment: Judgment normal.   Assessment/Plan: 1. Essential hypertension Blood pressure stable. Continue medication as prescribed   2. Abnormal renal function Will get renal arter ultrasound for further evaluation.  - US Renal Artery Stenosis; Future  3. Mixed hyperlipidemia Discussed recent lab results. Prudent diet information provided.   General Counseling: Palak verbalizes understanding of the findings of todays visit and agrees with plan of treatment. I have discussed any further diagnostic evaluation that may be needed or ordered today. We also reviewed her medications today. she has been encouraged to call the office with any questions or concerns that should arise related to todays visit.  This patient was seen by Leretha Pol FNP Collaboration with Dr Lavera Guise as a part of collaborative care agreement  Orders Placed This Encounter  Procedures  . US Renal Artery Stenosis      Time spent: 27 Minutes      Dr Lavera Guise Internal medicine

## 2019-05-10 ENCOUNTER — Ambulatory Visit: Payer: Medicare HMO

## 2019-05-10 DIAGNOSIS — N289 Disorder of kidney and ureter, unspecified: Secondary | ICD-10-CM

## 2019-05-10 DIAGNOSIS — R944 Abnormal results of kidney function studies: Secondary | ICD-10-CM

## 2019-05-12 NOTE — Progress Notes (Signed)
Review with patient during visit 05/17/2019

## 2019-05-17 ENCOUNTER — Other Ambulatory Visit: Payer: Self-pay

## 2019-05-17 ENCOUNTER — Ambulatory Visit (INDEPENDENT_AMBULATORY_CARE_PROVIDER_SITE_OTHER): Payer: Medicare HMO | Admitting: Nurse Practitioner

## 2019-05-17 ENCOUNTER — Encounter: Payer: Self-pay | Admitting: Nurse Practitioner

## 2019-05-17 VITALS — BP 146/70 | HR 62 | Resp 16 | Ht 65.0 in | Wt 172.0 lb

## 2019-05-17 DIAGNOSIS — D3001 Benign neoplasm of right kidney: Secondary | ICD-10-CM

## 2019-05-17 DIAGNOSIS — N289 Disorder of kidney and ureter, unspecified: Secondary | ICD-10-CM | POA: Diagnosis not present

## 2019-05-17 DIAGNOSIS — I10 Essential (primary) hypertension: Secondary | ICD-10-CM

## 2019-05-17 NOTE — Progress Notes (Signed)
[facility] [place], [place] [address]  Internal MEDICINE  Office Visit Note  Patient Name: Lisa Thompson  [id number]  [id number]  Date of Service: 05/29/2019  Chief Complaint  Patient presents with  . Medical Management of Chronic Issues     one week follow up US results     The patient is here to follow up for renal ultrasound. Renal functions had been elevated thepast few times her blood work was checked. Renal ultrasound did show 1.8cm right renal cyst. It was otherwise, unremarkable. She has no new concerns or complaints today.      Current Medication: Outpatient Encounter Medications as of 05/17/2019  Medication Sig  . amLODipine (NORVASC) 5 MG tablet TAKE 1 TABLET BY MOUTH AT BEDTIME FOR BLOOD PRESSURE  . bisoprolol (ZEBETA) 5 MG tablet Take 1 tablet (5 mg total) by mouth daily.  . diphenhydrAMINE HCl (BENADRYL ALLERGY PO) Take by mouth.  . Multiple Vitamins-Minerals (CENTRUM SILVER ADULT 50+ PO) Take by mouth daily.  . pneumococcal 23 valent vaccine (PNEUMOVAX 23) 25 MCG/0.5ML injection Inject 0.44ml IM once  . Polyethyl Glycol-Propyl Glycol (SYSTANE OP) Apply to eye as needed.   No facility-administered encounter medications on file as of 05/17/2019.     Surgical History: Past Surgical History:  Procedure Laterality Date  . ABDOMINAL HYSTERECTOMY    . CATARACT EXTRACTION W/PHACO Left 12/06/2016   Procedure: CATARACT EXTRACTION PHACO AND INTRAOCULAR LENS PLACEMENT (IOC);  Surgeon: Eulogio Bear, MD;  Location: [place];  Service: Ophthalmology;  Laterality: Left;  IVA Topical LEFT LATEX allergy  . CATARACT EXTRACTION W/PHACO Right 01/03/2017   Procedure: CATARACT EXTRACTION PHACO AND INTRAOCULAR LENS PLACEMENT ([employer]) RIGHT;  Surgeon: Eulogio Bear, MD;  Location: [place];  Service: Ophthalmology;  Laterality: Right;  IVA TOPICAL latex sensitivity  . CYST EXCISION     multiple. in different areas  . FOOT SURGERY   1997, 2007  . TONSILLECTOMY      Medical History: Past Medical History:  Diagnosis Date  . Arthritis    lower back  . Hypertension   . Motion sickness     Family History: Family History  Problem Relation Age of Onset  . Breast cancer Neg Hx     Social History   Socioeconomic History  . Marital status: Single    Spouse name: Not on file  . Number of children: Not on file  . Years of education: Not on file  . Highest education level: Not on file  Occupational History  . Not on file  Social Needs  . Financial resource strain: Not on file  . Food insecurity    Worry: Not on file    Inability: Not on file  . Transportation needs    Medical: Not on file    Non-medical: Not on file  Tobacco Use  . Smoking status: Never Smoker  . Smokeless tobacco: Never Used  Substance and Sexual Activity  . Alcohol use: Not Currently  . Drug use: Never  . Sexual activity: Not on file  Lifestyle  . Physical activity    Days per week: Not on file    Minutes per session: Not on file  . Stress: Not on file  Relationships  . Social Herbalist on phone: Not on file    Gets together: Not on file    Attends religious service: Not on file    Active member of club or organization: Not on file  Attends meetings of clubs or organizations: Not on file    Relationship status: Not on file  . Intimate partner violence    Fear of current or ex partner: Not on file    Emotionally abused: Not on file    Physically abused: Not on file    Forced sexual activity: Not on file  Other Topics Concern  . Not on file  Social History Narrative  . Not on file      Review of Systems  Constitutional: Negative for activity change, chills, fatigue and unexpected weight change.  HENT: Negative for congestion, postnasal drip, rhinorrhea, sneezing and sore throat.   Respiratory: Negative for apnea, cough, chest tightness and shortness of breath.   Cardiovascular: Negative for chest pain  and palpitations.       Mildly elevated blood pressure today.   Gastrointestinal: Negative for abdominal pain, constipation, diarrhea, nausea and vomiting.  Endocrine: Negative for cold intolerance, heat intolerance, polydipsia and polyuria.  Musculoskeletal: Negative for arthralgias, back pain, joint swelling, myalgias and neck pain.  Skin: Negative for rash.       Generalized itchiness. Improves when she takes benadryl at night.   Allergic/Immunologic: Negative for environmental allergies.  Neurological: Positive for headaches. Negative for tremors and numbness.  Hematological: Negative for adenopathy. Does not bruise/bleed easily.  Psychiatric/Behavioral: Negative for agitation, behavioral problems (Depression), sleep disturbance and suicidal ideas. The patient is not nervous/anxious.     Today's Vitals   05/17/19 1001  BP: (!) 146/70  Pulse: 62  Resp: 16  SpO2: 98%  Weight: 172 lb (78 kg)  Height: 5\' 5"  (1.651 m)   Body mass index is 28.62 kg/m.  Physical Exam Vitals signs and nursing note reviewed.  Constitutional:      General: She is not in acute distress.    Appearance: Normal appearance. She is well-developed. She is not diaphoretic.  HENT:     Head: Normocephalic and atraumatic.     Mouth/Throat:     Pharynx: No oropharyngeal exudate.  Eyes:     Pupils: Pupils are equal, round, and reactive to light.  Neck:     Musculoskeletal: Normal range of motion and neck supple.     Thyroid: No thyromegaly.     Vascular: No JVD.     Trachea: No tracheal deviation.  Cardiovascular:     Rate and Rhythm: Normal rate and regular rhythm.     Heart sounds: Normal heart sounds. No murmur. No friction rub. No gallop.   Pulmonary:     Effort: Pulmonary effort is normal. No respiratory distress.     Breath sounds: Normal breath sounds. No wheezing or rales.  Chest:     Chest wall: No tenderness.  Abdominal:     Palpations: Abdomen is soft.  Musculoskeletal: Normal range of  motion.  Lymphadenopathy:     Cervical: No cervical adenopathy.  Skin:    General: Skin is warm and dry.  Neurological:     Mental Status: She is alert and oriented to person, place, and time.     Cranial Nerves: No cranial nerve deficit.  Psychiatric:        Behavior: Behavior normal.        Thought Content: Thought content normal.        Judgment: Judgment normal.    Assessment/Plan:  1. Abnormal renal function Review results of renal ultrasound. Will continue to monitor.   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 Will repeat ultrasound  in one year.   3. Essential hypertension Generally stable. Continue bp medication as prescribed.   General Counseling: Lineth verbalizes understanding of the findings of todays visit and agrees with plan of treatment. I have discussed any further diagnostic evaluation that may be needed or ordered today. We also reviewed her medications today. she has been encouraged to call the office with any questions or concerns that should arise related to todays visit.  This patient was seen by Leretha Pol FNP Collaboration with Dr Lavera Guise as a part of collaborative care agreement   Time spent: 51 Minutes      Dr Lavera Guise Internal medicine

## 2019-05-20 DIAGNOSIS — R69 Illness, unspecified: Secondary | ICD-10-CM | POA: Diagnosis not present

## 2019-05-29 DIAGNOSIS — D3001 Benign neoplasm of right kidney: Secondary | ICD-10-CM | POA: Insufficient documentation

## 2019-07-30 ENCOUNTER — Other Ambulatory Visit: Payer: Self-pay | Admitting: Nurse Practitioner

## 2019-07-30 DIAGNOSIS — R944 Abnormal results of kidney function studies: Secondary | ICD-10-CM | POA: Diagnosis not present

## 2019-07-31 LAB — BASIC METABOLIC PANEL
BUN/Creatinine Ratio: 15 (ref 12–28)
BUN: 21 mg/dL (ref 8–27)
CO2: 21 mmol/L (ref 20–29)
Calcium: 9.4 mg/dL (ref 8.7–10.3)
Chloride: 104 mmol/L (ref 96–106)
Creatinine, Ser: 1.42 mg/dL — ABNORMAL HIGH (ref 0.57–1.00)
GFR calc Af Amer: 42 mL/min/{1.73_m2} — ABNORMAL LOW (ref >59)
GFR calc non Af Amer: 37 mL/min/{1.73_m2} — ABNORMAL LOW (ref >59)
Glucose: 124 mg/dL — ABNORMAL HIGH (ref 65–99)
Potassium: 4.6 mmol/L (ref 3.5–5.2)
Sodium: 141 mmol/L (ref 134–144)

## 2019-08-06 NOTE — Progress Notes (Signed)
Hey. Her renal functions continue to worsen. No diuretics. Renal ultrasound we did in 04/2019 was normal other than small renal cyst. Should I refer her to nephrology?

## 2019-08-27 ENCOUNTER — Telehealth: Payer: Self-pay

## 2019-08-27 NOTE — Telephone Encounter (Signed)
CONFIRMED AS A TELEVISIT ON 08-29-19

## 2019-08-29 ENCOUNTER — Ambulatory Visit (INDEPENDENT_AMBULATORY_CARE_PROVIDER_SITE_OTHER): Payer: Medicare HMO | Admitting: Nurse Practitioner

## 2019-08-29 ENCOUNTER — Other Ambulatory Visit: Payer: Self-pay

## 2019-08-29 ENCOUNTER — Encounter: Payer: Self-pay | Admitting: Nurse Practitioner

## 2019-08-29 DIAGNOSIS — I10 Essential (primary) hypertension: Secondary | ICD-10-CM

## 2019-08-29 DIAGNOSIS — F5101 Primary insomnia: Secondary | ICD-10-CM

## 2019-08-29 DIAGNOSIS — R69 Illness, unspecified: Secondary | ICD-10-CM | POA: Diagnosis not present

## 2019-08-29 MED ORDER — TRAZODONE HCL 50 MG PO TABS: ORAL_TABLET | ORAL | 1 refills | Status: DC

## 2019-08-29 MED ORDER — BISOPROLOL FUMARATE 5 MG PO TABS: 5.0000 mg | ORAL_TABLET | Freq: Every day | ORAL | 1 refills | Status: DC

## 2019-08-29 NOTE — Progress Notes (Signed)
[facility] [place], [place] [address]  Internal MEDICINE  Telephone Visit  Patient Name: Lisa Thompson  [id number]  [id number]  Date of Service: 08/29/2019  I connected with the patient at 12:38pm by telephone and verified the patients identity using two identifiers.   I discussed the limitations, risks, security and privacy concerns of performing an evaluation and management service by telephone and the availability of in person appointments. I also discussed with the patient that there may be a patient responsible charge related to the service.  The patient expressed understanding and agrees to proceed.    Chief Complaint  Patient presents with  . Telephone Assessment  . Telephone Screen  . Hypertension  . Medical Management of Chronic Issues    not sleeping well    The patient has been contacted via telephone for follow up visit due to concerns for spread of novel coronavirus. She presents for routine follow up visit. She states that she hasn't slept in about three months. She initially thought this was stressed because her partner had to have surgery on one leg then, right away, had to have surgery on the other leg. She states that she does not have increased stress any longer. States that she has tried melatonin 2.5mg  for five nights, but did not notice any improvement. She turns off lights and all electronic devices. She can fall asleep but often is sleeping for 4 to 5 hours and then gets up and cannot go back to sleep. She is still very tired when she wakes up and does not feel refreshed.       Current Medication: Outpatient Encounter Medications as of 08/29/2019  Medication Sig  . amLODipine (NORVASC) 5 MG tablet TAKE 1 TABLET BY MOUTH AT BEDTIME FOR BLOOD PRESSURE  . bisoprolol (ZEBETA) 5 MG tablet Take 1 tablet (5 mg total) by mouth daily.  . diphenhydrAMINE HCl (BENADRYL ALLERGY PO) Take by mouth.  . Multiple Vitamins-Minerals (CENTRUM SILVER  ADULT 50+ PO) Take by mouth daily.  . pneumococcal 23 valent vaccine (PNEUMOVAX 23) 25 MCG/0.5ML injection Inject 0.90ml IM once  . Polyethyl Glycol-Propyl Glycol (SYSTANE OP) Apply to eye as needed.  . [DISCONTINUED] bisoprolol (ZEBETA) 5 MG tablet Take 1 tablet (5 mg total) by mouth daily.  . traZODone (DESYREL) 50 MG tablet Take 1/2 to 1 tablet po QHS prn insomnia   No facility-administered encounter medications on file as of 08/29/2019.    Surgical History: Past Surgical History:  Procedure Laterality Date  . ABDOMINAL HYSTERECTOMY    . CATARACT EXTRACTION W/PHACO Left 12/06/2016   Procedure: CATARACT EXTRACTION PHACO AND INTRAOCULAR LENS PLACEMENT (IOC);  Surgeon: Eulogio Bear, MD;  Location: [place];  Service: Ophthalmology;  Laterality: Left;  IVA Topical LEFT LATEX allergy  . CATARACT EXTRACTION W/PHACO Right 01/03/2017   Procedure: CATARACT EXTRACTION PHACO AND INTRAOCULAR LENS PLACEMENT ([place]) RIGHT;  Surgeon: Eulogio Bear, MD;  Location: [place];  Service: Ophthalmology;  Laterality: Right;  IVA TOPICAL latex sensitivity  . CYST EXCISION     multiple. in different areas  . FOOT SURGERY  1997, 2007  . TONSILLECTOMY      Medical History: Past Medical History:  Diagnosis Date  . Arthritis    lower back  . Hypertension   . Motion sickness     Family History: Family History  Problem Relation Age of Onset  . Breast cancer Neg Hx     Social History   Socioeconomic History  .  Marital status: Single    Spouse name: Not on file  . Number of children: Not on file  . Years of education: Not on file  . Highest education level: Not on file  Occupational History  . Not on file  Tobacco Use  . Smoking status: Never Smoker  . Smokeless tobacco: Never Used  Substance and Sexual Activity  . Alcohol use: Not Currently  . Drug use: Never  . Sexual activity: Not on file  Other Topics Concern  . Not on file  Social History Narrative  .  Not on file   Social Determinants of Health   Financial Resource Strain:   . Difficulty of Paying Living Expenses: Not on file  Food Insecurity:   . Worried About Charity fundraiser in the Last Year: Not on file  . Ran Out of Food in the Last Year: Not on file  Transportation Needs:   . Lack of Transportation (Medical): Not on file  . Lack of Transportation (Non-Medical): Not on file  Physical Activity:   . Days of Exercise per Week: Not on file  . Minutes of Exercise per Session: Not on file  Stress:   . Feeling of Stress : Not on file  Social Connections:   . Frequency of Communication with Friends and Family: Not on file  . Frequency of Social Gatherings with Friends and Family: Not on file  . Attends Religious Services: Not on file  . Active Member of Clubs or Organizations: Not on file  . Attends Archivist Meetings: Not on file  . Marital Status: Not on file  Intimate Partner Violence:   . Fear of Current or Ex-Partner: Not on file  . Emotionally Abused: Not on file  . Physically Abused: Not on file  . Sexually Abused: Not on file      Review of Systems  Constitutional: Positive for fatigue. Negative for activity change, chills and unexpected weight change.  HENT: Negative for congestion, postnasal drip, rhinorrhea, sneezing and sore throat.   Respiratory: Negative for apnea, cough, chest tightness and shortness of breath.   Cardiovascular: Negative for chest pain and palpitations.  Gastrointestinal: Negative for abdominal pain, constipation, diarrhea, nausea and vomiting.  Endocrine: Negative for cold intolerance, heat intolerance, polydipsia and polyuria.  Musculoskeletal: Negative for arthralgias, back pain, joint swelling, myalgias and neck pain.  Skin: Negative for rash.  Allergic/Immunologic: Negative for environmental allergies.  Neurological: Positive for headaches. Negative for tremors and numbness.  Hematological: Negative for adenopathy. Does  not bruise/bleed easily.  Psychiatric/Behavioral: Positive for sleep disturbance. Negative for agitation, behavioral problems (Depression) and suicidal ideas. The patient is not nervous/anxious.     Vital Signs: There were no vitals taken for this visit.   Observation/Objective:   The patient is alert and oriented. She is pleasant and answers all questions appropriately. Breathing is non-labored. She is in no acute distress at this time.    Assessment/Plan:  1. Essential hypertension Stable. Continue bp medications as prescribed. Refilled bisoprolol today.  - bisoprolol (ZEBETA) 5 MG tablet; Take 1 tablet (5 mg total) by mouth daily.  Dispense: 90 tablet; Refill: 1  2. Primary insomnia Start trazodone 50mg . Advised she start with 1/2 tablet at bedtime as needed. May increase to 1 tablet at bedtime as needed and as tolerated.  - traZODone (DESYREL) 50 MG tablet; Take 1/2 to 1 tablet po QHS prn insomnia  Dispense: 30 tablet; Refill: 1  General Counseling: Latorya verbalizes understanding of the findings of  today's phone visit and agrees with plan of treatment. I have discussed any further diagnostic evaluation that may be needed or ordered today. We also reviewed her medications today. she has been encouraged to call the office with any questions or concerns that should arise related to todays visit.   This patient was seen by Ramos with Dr Lavera Guise as a part of collaborative care agreement  Meds ordered this encounter  Medications  . traZODone (DESYREL) 50 MG tablet    Sig: Take 1/2 to 1 tablet po QHS prn insomnia    Dispense:  30 tablet    Refill:  1    Order Specific Question:   Supervising Provider    Answer:   Lavera Guise [place]  . bisoprolol (ZEBETA) 5 MG tablet    Sig: Take 1 tablet (5 mg total) by mouth daily.    Dispense:  90 tablet    Refill:  1    Order Specific Question:   Supervising Provider    Answer:   Lavera Guise [id number]     Time spent: 35 Minutes    Dr Lavera Guise Internal medicine

## 2019-09-11 ENCOUNTER — Telehealth: Payer: Self-pay

## 2019-09-11 NOTE — Telephone Encounter (Signed)
Please let her know that her renal functions are still elevated. This time, slightly more elevated. We have already done inaging of the kidneys, which really look good. This is something we will continue to monitor. She needs to drink plenty of water. Thanks.

## 2019-09-11 NOTE — Telephone Encounter (Signed)
Pt was notified.  

## 2019-09-26 ENCOUNTER — Telehealth: Payer: Self-pay

## 2019-09-26 NOTE — Telephone Encounter (Signed)
CONFIRMED 09-30-19 OV AS VIRTUAL.

## 2019-09-30 ENCOUNTER — Ambulatory Visit (INDEPENDENT_AMBULATORY_CARE_PROVIDER_SITE_OTHER): Payer: Medicare HMO | Admitting: Nurse Practitioner

## 2019-09-30 ENCOUNTER — Other Ambulatory Visit: Payer: Self-pay

## 2019-09-30 ENCOUNTER — Encounter: Payer: Self-pay | Admitting: Nurse Practitioner

## 2019-09-30 VITALS — Ht 65.0 in

## 2019-09-30 DIAGNOSIS — I10 Essential (primary) hypertension: Secondary | ICD-10-CM

## 2019-09-30 DIAGNOSIS — R5383 Other fatigue: Secondary | ICD-10-CM

## 2019-09-30 DIAGNOSIS — F5101 Primary insomnia: Secondary | ICD-10-CM | POA: Diagnosis not present

## 2019-09-30 DIAGNOSIS — R69 Illness, unspecified: Secondary | ICD-10-CM | POA: Diagnosis not present

## 2019-09-30 NOTE — Progress Notes (Cosign Needed)
[facility] [place], [place] [address]  Internal MEDICINE  Telephone Visit  Patient Name: Lisa Thompson  [id number]  [id number]  Date of Service: 09/30/2019  I connected with the patient at 8:37am by telephone and verified the patients identity using two identifiers.   I discussed the limitations, risks, security and privacy concerns of performing an evaluation and management service by telephone and the availability of in person appointments. I also discussed with the patient that there may be a patient responsible charge related to the service.  The patient expressed understanding and agrees to proceed.    Chief Complaint  Patient presents with  . Telephone Assessment    pt states she gets "faint" when she gets out of bed and walks to bathroom  . Telephone Screen  . Hypertension  . Insomnia    has helped her go to sleep but does not sleep throughout the night and then cannot go back to sleep , only takes half a tab at 9 pm     The patient has been contacted via telephone for follow up visit due to concerns for spread of novel coronavirus. She presents for follow up visit. The patient was started on trazodone 50mg  to help with her sleep. She is taking 1/2 tablet at around 9pm. She states that she is falling asleep well. some nights, she is sleeping the entire night. Other nights, she is waking up after a few hours and cannot go back to sleep. She has not increased to a whole tablet at night, as she is not a "pill person"and does not want to take too much. She has noted no negative side effects from starting this medication.  She states that on some of the nights she does not sleep well, she feels faint the next day. Has to sit down and rest while getting dressed and ready for the day. Generally happens first thing in the morning and gets better as the day goes on.       Current Medication: Outpatient Encounter Medications as of 09/30/2019  Medication Sig   . amLODipine (NORVASC) 5 MG tablet TAKE 1 TABLET BY MOUTH AT BEDTIME FOR BLOOD PRESSURE  . bisoprolol (ZEBETA) 5 MG tablet Take 1 tablet (5 mg total) by mouth daily.  . diphenhydrAMINE HCl (BENADRYL ALLERGY PO) Take by mouth.  . Multiple Vitamins-Minerals (CENTRUM SILVER ADULT 50+ PO) Take by mouth daily.  . pneumococcal 23 valent vaccine (PNEUMOVAX 23) 25 MCG/0.5ML injection Inject 0.52ml IM once  . Polyethyl Glycol-Propyl Glycol (SYSTANE OP) Apply to eye as needed.  . traZODone (DESYREL) 50 MG tablet Take 1/2 to 1 tablet po QHS prn insomnia   No facility-administered encounter medications on file as of 09/30/2019.    Surgical History: Past Surgical History:  Procedure Laterality Date  . ABDOMINAL HYSTERECTOMY    . CATARACT EXTRACTION W/PHACO Left 12/06/2016   Procedure: CATARACT EXTRACTION PHACO AND INTRAOCULAR LENS PLACEMENT ([facility]);  Surgeon: Eulogio Bear, MD;  Location: [facility];  Service: Ophthalmology;  Laterality: Left;  IVA Topical LEFT LATEX allergy  . CATARACT EXTRACTION W/PHACO Right 01/03/2017   Procedure: CATARACT EXTRACTION PHACO AND INTRAOCULAR LENS PLACEMENT ([place]) RIGHT;  Surgeon: Eulogio Bear, MD;  Location: [facility];  Service: Ophthalmology;  Laterality: Right;  IVA TOPICAL latex sensitivity  . CYST EXCISION     multiple. in different areas  . FOOT SURGERY  1997, 2007  . TONSILLECTOMY      Medical History: Past Medical History:  Diagnosis Date  . Arthritis    lower back  . Hypertension   . Motion sickness     Family History: Family History  Problem Relation Age of Onset  . Breast cancer Neg Hx     Social History   Socioeconomic History  . Marital status: Single    Spouse name: Not on file  . Number of children: Not on file  . Years of education: Not on file  . Highest education level: Not on file  Occupational History  . Not on file  Tobacco Use  . Smoking status: Never Smoker  . Smokeless tobacco: Never Used   Substance and Sexual Activity  . Alcohol use: Not Currently  . Drug use: Never  . Sexual activity: Not on file  Other Topics Concern  . Not on file  Social History Narrative  . Not on file   Social Determinants of Health   Financial Resource Strain:   . Difficulty of Paying Living Expenses: Not on file  Food Insecurity:   . Worried About Charity fundraiser in the Last Year: Not on file  . Ran Out of Food in the Last Year: Not on file  Transportation Needs:   . Lack of Transportation (Medical): Not on file  . Lack of Transportation (Non-Medical): Not on file  Physical Activity:   . Days of Exercise per Week: Not on file  . Minutes of Exercise per Session: Not on file  Stress:   . Feeling of Stress : Not on file  Social Connections:   . Frequency of Communication with Friends and Family: Not on file  . Frequency of Social Gatherings with Friends and Family: Not on file  . Attends Religious Services: Not on file  . Active Member of Clubs or Organizations: Not on file  . Attends Archivist Meetings: Not on file  . Marital Status: Not on file  Intimate Partner Violence:   . Fear of Current or Ex-Partner: Not on file  . Emotionally Abused: Not on file  . Physically Abused: Not on file  . Sexually Abused: Not on file      Review of Systems  Constitutional: Positive for fatigue. Negative for activity change, chills and unexpected weight change.  HENT: Negative for congestion, postnasal drip, rhinorrhea, sneezing and sore throat.   Respiratory: Negative for apnea, cough, chest tightness and shortness of breath.   Cardiovascular: Negative for chest pain and palpitations.  Gastrointestinal: Negative for abdominal pain, constipation, diarrhea, nausea and vomiting.  Endocrine: Negative for cold intolerance, heat intolerance, polydipsia and polyuria.  Musculoskeletal: Negative for arthralgias, back pain, joint swelling, myalgias and neck pain.  Skin: Negative for rash.   Allergic/Immunologic: Negative for environmental allergies.  Neurological: Positive for weakness and headaches. Negative for tremors and numbness.       Weakness is occurring on the mornings after she does not sleep well.   Hematological: Negative for adenopathy. Does not bruise/bleed easily.  Psychiatric/Behavioral: Positive for sleep disturbance. Negative for agitation, behavioral problems (Depression) and suicidal ideas. The patient is nervous/anxious.        Improved slightly. Taking trazodone 25mg  at ngiht.     Today's Vitals   09/30/19 0828  Height: 5\' 5"  (1.651 m)   Body mass index is 28.62 kg/m.  Observation/Objective:   The patient is alert and oriented. She is pleasant and answers all questions appropriately. Breathing is non-labored. She is in no acute distress at this time.    Assessment/Plan: 1. Primary insomnia  Improved, but still not optimal sleep. Advised her to increase dose of trazodone to full tablet, 50mg , at bedtime as needed.   2. Essential hypertension conitnue bp medication as prescribed   3. Other fatigue Improving on days after she has good night's sleep. Advised she increase trazodone to 50mg  at bedtime. Will monitor.   General Counseling: Cheyann verbalizes understanding of the findings of today's phone visit and agrees with plan of treatment. I have discussed any further diagnostic evaluation that may be needed or ordered today. We also reviewed her medications today. she has been encouraged to call the office with any questions or concerns that should arise related to todays visit.   This patient was seen by Leretha Pol FNP Collaboration with Dr Lavera Guise as a part of collaborative care agreement  Time spent: 25 Minutes    Dr Lavera Guise Internal medicine

## 2019-11-06 DIAGNOSIS — H40003 Preglaucoma, unspecified, bilateral: Secondary | ICD-10-CM | POA: Diagnosis not present

## 2019-11-25 ENCOUNTER — Other Ambulatory Visit: Payer: Self-pay

## 2019-11-25 DIAGNOSIS — I10 Essential (primary) hypertension: Secondary | ICD-10-CM

## 2019-11-25 MED ORDER — AMLODIPINE BESYLATE 5 MG PO TABS: ORAL_TABLET | ORAL | 1 refills | Status: DC

## 2019-11-25 MED ORDER — BISOPROLOL FUMARATE 5 MG PO TABS: 5.0000 mg | ORAL_TABLET | Freq: Every day | ORAL | 1 refills | Status: DC

## 2019-11-26 ENCOUNTER — Other Ambulatory Visit: Payer: Self-pay

## 2019-11-26 DIAGNOSIS — I10 Essential (primary) hypertension: Secondary | ICD-10-CM

## 2019-11-26 MED ORDER — AMLODIPINE BESYLATE 5 MG PO TABS: ORAL_TABLET | ORAL | 1 refills | Status: DC

## 2019-11-26 MED ORDER — BISOPROLOL FUMARATE 5 MG PO TABS: 5.0000 mg | ORAL_TABLET | Freq: Every day | ORAL | 1 refills | Status: DC

## 2019-12-03 ENCOUNTER — Other Ambulatory Visit: Payer: Self-pay

## 2019-12-03 DIAGNOSIS — I10 Essential (primary) hypertension: Secondary | ICD-10-CM

## 2019-12-03 MED ORDER — AMLODIPINE BESYLATE 5 MG PO TABS: ORAL_TABLET | ORAL | 1 refills | Status: DC

## 2019-12-03 MED ORDER — BISOPROLOL FUMARATE 5 MG PO TABS: 5.0000 mg | ORAL_TABLET | Freq: Every day | ORAL | 1 refills | Status: DC

## 2020-03-05 ENCOUNTER — Telehealth: Payer: Self-pay

## 2020-03-05 NOTE — Telephone Encounter (Signed)
Confirmed and screened for 03-09-20 ov. 

## 2020-03-09 ENCOUNTER — Other Ambulatory Visit: Payer: Self-pay

## 2020-03-09 ENCOUNTER — Encounter: Payer: Self-pay | Admitting: Nurse Practitioner

## 2020-03-09 ENCOUNTER — Ambulatory Visit (INDEPENDENT_AMBULATORY_CARE_PROVIDER_SITE_OTHER): Payer: Medicare HMO | Admitting: Nurse Practitioner

## 2020-03-09 VITALS — BP 147/71 | HR 57 | Temp 96.4°F | Resp 16 | Ht 65.0 in | Wt 169.3 lb

## 2020-03-09 DIAGNOSIS — E782 Mixed hyperlipidemia: Secondary | ICD-10-CM

## 2020-03-09 DIAGNOSIS — I10 Essential (primary) hypertension: Secondary | ICD-10-CM

## 2020-03-09 DIAGNOSIS — Z0001 Encounter for general adult medical examination with abnormal findings: Secondary | ICD-10-CM | POA: Diagnosis not present

## 2020-03-09 DIAGNOSIS — F5101 Primary insomnia: Secondary | ICD-10-CM

## 2020-03-09 DIAGNOSIS — R300 Dysuria: Secondary | ICD-10-CM | POA: Diagnosis not present

## 2020-03-09 DIAGNOSIS — Z1231 Encounter for screening mammogram for malignant neoplasm of breast: Secondary | ICD-10-CM | POA: Diagnosis not present

## 2020-03-09 NOTE — Progress Notes (Signed)
[facility] [place], [place] [address]  Internal MEDICINE  Office Visit Note  Patient Name: Lisa Thompson  [id number]  [id number]  Date of Service: 03/16/2020  Chief Complaint  Patient presents with  . Medicare Wellness  . Arthritis  . Hypertension  . Back Pain    left side towards the back   . spot on leg and under breasts     The patient is here for health maintenance exam. She has some pain in left lower part of her back. Woke up with this yesterday. She denies straining her back in any way that si is awar. She feels like she may have slept wrong on it.  Admits to having poor sleep again. Initially had this issue at the end of 2020. Was started on trazodone. Stated this was helping well until it wasn't. States that now, since it is basketball season, she is sleeping better and more soundly. Does not want to retry trazodone or try new medication to help with sleep.    Pt is here for routine health maintenance examination  Current Medication: Outpatient Encounter Medications as of 03/09/2020  Medication Sig  . amLODipine (NORVASC) 5 MG tablet TAKE 1 TABLET BY MOUTH AT BEDTIME FOR BLOOD PRESSURE  . bisoprolol (ZEBETA) 5 MG tablet Take 1 tablet (5 mg total) by mouth daily.  . diphenhydrAMINE HCl (BENADRYL ALLERGY PO) Take by mouth.  . Multiple Vitamins-Minerals (CENTRUM SILVER ADULT 50+ PO) Take by mouth daily.  . pneumococcal 23 valent vaccine (PNEUMOVAX 23) 25 MCG/0.5ML injection Inject 0.27m IM once  . Polyethyl Glycol-Propyl Glycol (SYSTANE OP) Apply to eye as needed.  . traZODone (DESYREL) 50 MG tablet Take 1/2 to 1 tablet po QHS prn insomnia   No facility-administered encounter medications on file as of 03/09/2020.    Surgical History: Past Surgical History:  Procedure Laterality Date  . ABDOMINAL HYSTERECTOMY    . CATARACT EXTRACTION W/PHACO Left 12/06/2016   Procedure: CATARACT EXTRACTION PHACO AND INTRAOCULAR LENS PLACEMENT (IOC);   Surgeon: BEulogio Bear MD;  [place] [facility]  Service: Ophthalmology;  Laterality: Left;  IVA Topical LEFT LATEX allergy  . CATARACT EXTRACTION W/PHACO Right 01/03/2017   Procedure: CATARACT EXTRACTION PHACO AND INTRAOCULAR LENS PLACEMENT ([place] RIGHT;  Surgeon: BEulogio Bear MD;  [place] [facility]  Service: Ophthalmology;  Laterality: Right;  IVA TOPICAL latex sensitivity  . CYST EXCISION     multiple. in different areas  . FOOT SURGERY  1997, 2007  . TONSILLECTOMY      Medical History: Past Medical History:  Diagnosis Date  . Arthritis    lower back  . Hypertension   . Motion sickness     Family History: Family History  Problem Relation Age of Onset  . Breast cancer Neg Hx       Review of Systems  Constitutional: Positive for fatigue. Negative for activity change, chills and unexpected weight change.  HENT: Negative for congestion, postnasal drip, rhinorrhea, sneezing and sore throat.   Respiratory: Negative for apnea, cough, chest tightness and shortness of breath.   Cardiovascular: Negative for chest pain and palpitations.  Gastrointestinal: Negative for abdominal pain, constipation, diarrhea, nausea and vomiting.  Endocrine: Negative for cold intolerance, heat intolerance, polydipsia and polyuria.  Genitourinary: Negative for dysuria, frequency and urgency.  Musculoskeletal: Positive for neck pain. Negative for arthralgias, back pain, joint swelling and myalgias.       Tenderness and stiffness of the left side of the neck. Started  upon waking this morning.   Skin: Negative for rash.  Allergic/Immunologic: Negative for environmental allergies.  Neurological: Positive for headaches. Negative for tremors, weakness and numbness.  Hematological: Negative for adenopathy. Does not bruise/bleed easily.  Psychiatric/Behavioral: Positive for sleep disturbance. Negative for agitation, behavioral problems (Depression) and suicidal ideas. The  patient is nervous/anxious.        Patient no longer taking trazodone.      Today's Vitals   03/09/20 1014  BP: (!) 147/71  Pulse: (!) 57  Resp: 16  Temp: (!) 96.4 F (35.8 C)  SpO2: 100%  Weight: 169 lb 4.8 oz (76.8 kg)  Height: 5' 5"  (1.651 m)   Body mass index is 28.17 kg/m.  Physical Exam Vitals and nursing note reviewed.  Constitutional:      General: She is not in acute distress.    Appearance: Normal appearance. She is well-developed. She is not diaphoretic.  HENT:     Head: Normocephalic and atraumatic.     Nose: Nose normal.     Mouth/Throat:     Pharynx: No oropharyngeal exudate.  Eyes:     Pupils: Pupils are equal, round, and reactive to light.  Neck:     Thyroid: No thyromegaly.     Vascular: No JVD.     Trachea: No tracheal deviation.  Cardiovascular:     Rate and Rhythm: Normal rate and regular rhythm.     Pulses: Normal pulses.     Heart sounds: Normal heart sounds. No murmur heard.  No friction rub. No gallop.   Pulmonary:     Effort: Pulmonary effort is normal. No respiratory distress.     Breath sounds: Normal breath sounds. No wheezing or rales.  Chest:     Chest wall: No tenderness.     Breasts:        Right: Normal. No swelling, bleeding, inverted nipple, mass, nipple discharge, skin change or tenderness.        Left: Normal. No swelling, bleeding, inverted nipple, mass, nipple discharge, skin change or tenderness.  Abdominal:     General: Bowel sounds are normal.     Palpations: Abdomen is soft.     Tenderness: There is no abdominal tenderness.  Musculoskeletal:        General: Normal range of motion.     Cervical back: Normal range of motion and neck supple.  Lymphadenopathy:     Cervical: No cervical adenopathy.     Upper Body:     Right upper body: No axillary adenopathy.     Left upper body: No axillary adenopathy.  Skin:    General: Skin is warm and dry.  Neurological:     General: No focal deficit present.     Mental Status:  She is alert and oriented to person, place, and time.     Cranial Nerves: No cranial nerve deficit.  Psychiatric:        Mood and Affect: Mood normal.        Behavior: Behavior normal.        Thought Content: Thought content normal.        Judgment: Judgment normal.    Depression screen [facility] 2/9 03/09/2020 08/29/2019 05/17/2019 03/01/2019 08/30/2018  Decreased Interest 0 0 0 0 0  Down, Depressed, Hopeless - 0 0 0 0  PHQ - 2 Score 0 0 0 0 0    Functional Status Survey: Is the patient deaf or have difficulty hearing?: Yes Does the patient have difficulty seeing, even when wearing  glasses/contacts?: No Does the patient have difficulty concentrating, remembering, or making decisions?: Yes Does the patient have difficulty walking or climbing stairs?: No Does the patient have difficulty dressing or bathing?: No Does the patient have difficulty doing errands alone such as visiting a doctor's office or shopping?: No  MMSE - [place] Exam 03/09/2020 03/01/2019 02/27/2018  Orientation to time 5 5 5   Orientation to Place 5 5 5   Registration 3 3 3   Attention/ Calculation 5 5 5   Recall 3 3 3   Language- name 2 objects 2 2 2   Language- repeat 1 1 1   Language- follow 3 step command 3 3 3   Language- read & follow direction 1 1 1   Write a sentence 1 1 1   Copy design 1 1 1   Total score 30 30 30     Fall Risk  03/09/2020 08/29/2019 05/17/2019 03/01/2019 08/30/2018  Falls in the past year? 0 0 0 0 0  Number falls in past yr: - - 0 0 -  Injury with Fall? - - 0 - -     LABS: Recent Results (from the past 2160 hour(s))  Urinalysis, Routine w reflex microscopic     Status: Abnormal   Collection Time: 03/09/20 10:17 AM  Result Value Ref Range   Specific Gravity, UA 1.017 1.005 - 1.030   pH, UA 7.0 5.0 - 7.5   Color, UA Yellow Yellow   Appearance Ur Cloudy (A) Clear   Leukocytes,UA Negative Negative   Protein,UA 1+ (A) Negative/Trace   Glucose, UA Negative Negative   Ketones, UA Trace (A)  Negative   RBC, UA Negative Negative   Bilirubin, UA Negative Negative   Urobilinogen, Ur 0.2 0.2 - 1.0 mg/dL   Nitrite, UA Negative Negative   Microscopic Examination See below:     Comment: Microscopic was indicated and was performed.  Microscopic Examination     Status: Abnormal   Collection Time: 03/09/20 10:17 AM   Urine  Result Value Ref Range   WBC, UA 0-5 0 - 5 /hpf   RBC 3-10 (A) 0 - 2 /hpf   Epithelial Cells (non renal) 0-10 0 - 10 /hpf   Casts None seen None seen /lpf   Bacteria, UA Many (A) None seen/Few  Comprehensive metabolic panel     Status: Abnormal   Collection Time: 03/10/20 10:39 AM  Result Value Ref Range   Glucose 92 65 - 99 mg/dL   BUN 18 8 - 27 mg/dL   Creatinine, Ser 1.33 (H) 0.57 - 1.00 mg/dL   GFR calc non Af Amer 40 (L) >59 mL/min/1.73   GFR calc Af Amer 46 (L) >59 mL/min/1.73    Comment: **[facility] currently reports eGFR in compliance with the current**   recommendations of the [employer]. [facility] will   update reporting as new guidelines are published from the NKF-ASN   Task force.    BUN/Creatinine Ratio 14 12 - 28   Sodium 140 134 - 144 mmol/L   Potassium 4.7 3.5 - 5.2 mmol/L   Chloride 102 96 - 106 mmol/L   CO2 21 20 - 29 mmol/L   Calcium 9.8 8.7 - 10.3 mg/dL   Total Protein 7.5 6.0 - 8.5 g/dL   Albumin 4.4 3.7 - 4.7 g/dL   Globulin, Total 3.1 1.5 - 4.5 g/dL   Albumin/Globulin Ratio 1.4 1.2 - 2.2   Bilirubin Total 0.5 0.0 - 1.2 mg/dL   Alkaline Phosphatase 83 48 - 121 IU/L   AST 19 0 -  40 IU/L   ALT 12 0 - 32 IU/L  CBC     Status: None   Collection Time: 03/10/20 10:39 AM  Result Value Ref Range   WBC 6.2 3.4 - 10.8 x10E3/uL   RBC 4.48 3.77 - 5.28 x10E6/uL   Hemoglobin 14.1 11.1 - 15.9 g/dL   Hematocrit 41.4 34.0 - 46.6 %   MCV 92 79 - 97 fL   MCH 31.5 26.6 - 33.0 pg   MCHC 34.1 31 - 35 g/dL   RDW 13.4 11.7 - 15.4 %   Platelets 261 150 - 450 x10E3/uL  Lipid Panel With LDL/HDL Ratio     Status: Abnormal    Collection Time: 03/10/20 10:39 AM  Result Value Ref Range   Cholesterol, Total 231 (H) 100 - 199 mg/dL   Triglycerides 110 0 - 149 mg/dL   HDL 71 >39 mg/dL   VLDL Cholesterol Cal 19 5 - 40 mg/dL   LDL Chol Calc (NIH) 141 (H) 0 - 99 mg/dL   LDL/HDL Ratio 2.0 0.0 - 3.2 ratio    Comment:                                     LDL/HDL Ratio                                             Men  Women                               1/2 Avg.Risk  1.0    1.5                                   Avg.Risk  3.6    3.2                                2X Avg.Risk  6.2    5.0                                3X Avg.Risk  8.0    6.1   Hgb A1c w/o eAG     Status: Abnormal   Collection Time: 03/10/20 10:39 AM  Result Value Ref Range   Hgb A1c MFr Bld 5.9 (H) 4.8 - 5.6 %    Comment:          Prediabetes: 5.7 - 6.4          Diabetes: >6.4          Glycemic control for adults with diabetes: <7.0   T4, free     Status: None   Collection Time: 03/10/20 10:39 AM  Result Value Ref Range   Free T4 1.19 0.82 - 1.77 ng/dL  TSH     Status: None   Collection Time: 03/10/20 10:39 AM  Result Value Ref Range   TSH 1.690 0.450 - 4.500 uIU/mL  VITAMIN D 25 Hydroxy (Vit-D Deficiency, Fractures)     Status: None   Collection Time: 03/10/20 10:39 AM  Result Value Ref Range   Vit D, 25-Hydroxy 30.3 30.0 -  100.0 ng/mL    Comment: Vitamin D deficiency has been defined by the [place] practice guideline as a level of serum 25-OH vitamin D less than 20 ng/mL (1,2). The Endocrine Society went on to further define vitamin D insufficiency as a level between 21 and 29 ng/mL (2). 1. IOM (Institute of Medicine). 2010. Dietary reference    intakes for calcium and D. [place]: The    [employer]. 2. Holick MF, Binkley [place], Bischoff-Ferrari HA, et al.    Evaluation, treatment, and prevention of vitamin D    deficiency: an Endocrine Society clinical practice    guideline. JCEM.  2011 Jul; 96(7):1911-30.   HIV Antibody (routine testing w rflx)     Status: None   Collection Time: 03/10/20 10:39 AM  Result Value Ref Range   HIV Screen 4th Generation wRfx Non Reactive Non Reactive    Assessment/Plan:  1. Encounter for general adult medical examination with abnormal findings Annual health maintenance exam today. Order slip given to have routine, fasting labs checked.   2. Essential hypertension Stable. Continue bp medication as prescribed   3. Primary insomnia Recommended retry of trazodone as needed. Will monitor.   4. Mixed hyperlipidemia Check fasting labs. Treat as indicated   5. Dysuria - Urinalysis, Routine w reflex microscopic  6. Encounter for screening mammogram for malignant neoplasm of breast - MM DIGITAL SCREENING BILATERAL; Future   General Counseling: Alenah verbalizes understanding of the findings of todays visit and agrees with plan of treatment. I have discussed any further diagnostic evaluation that may be needed or ordered today. We also reviewed her medications today. she has been encouraged to call the office with any questions or concerns that should arise related to todays visit.    Counseling:  This patient was seen by Leretha Pol FNP Collaboration with Dr Lavera Guise as a part of collaborative care agreement  Orders Placed This Encounter  Procedures  . Microscopic Examination  . MM DIGITAL SCREENING BILATERAL  . Urinalysis, Routine w reflex microscopic     Total time spent: 45 Minutes  Time spent includes review of chart, medications, test results, and follow up plan with the patient.     Lavera Guise, MD  Internal Medicine

## 2020-03-10 ENCOUNTER — Other Ambulatory Visit: Payer: Self-pay | Admitting: Nurse Practitioner

## 2020-03-10 DIAGNOSIS — I10 Essential (primary) hypertension: Secondary | ICD-10-CM | POA: Diagnosis not present

## 2020-03-10 DIAGNOSIS — E559 Vitamin D deficiency, unspecified: Secondary | ICD-10-CM | POA: Diagnosis not present

## 2020-03-10 DIAGNOSIS — R7301 Impaired fasting glucose: Secondary | ICD-10-CM | POA: Diagnosis not present

## 2020-03-10 DIAGNOSIS — Z0001 Encounter for general adult medical examination with abnormal findings: Secondary | ICD-10-CM | POA: Diagnosis not present

## 2020-03-10 LAB — URINALYSIS, ROUTINE W REFLEX MICROSCOPIC
Bilirubin, UA: NEGATIVE
Glucose, UA: NEGATIVE
Leukocytes,UA: NEGATIVE
Nitrite, UA: NEGATIVE
RBC, UA: NEGATIVE
Specific Gravity, UA: 1.017 (ref 1.005–1.030)
Urobilinogen, Ur: 0.2 mg/dL (ref 0.2–1.0)
pH, UA: 7 (ref 5.0–7.5)

## 2020-03-10 LAB — MICROSCOPIC EXAMINATION: Casts: NONE SEEN /lpf

## 2020-03-11 LAB — COMPREHENSIVE METABOLIC PANEL
ALT: 12 IU/L (ref 0–32)
AST: 19 IU/L (ref 0–40)
Albumin/Globulin Ratio: 1.4 (ref 1.2–2.2)
Albumin: 4.4 g/dL (ref 3.7–4.7)
Alkaline Phosphatase: 83 IU/L (ref 48–121)
BUN/Creatinine Ratio: 14 (ref 12–28)
BUN: 18 mg/dL (ref 8–27)
Bilirubin Total: 0.5 mg/dL (ref 0.0–1.2)
CO2: 21 mmol/L (ref 20–29)
Calcium: 9.8 mg/dL (ref 8.7–10.3)
Chloride: 102 mmol/L (ref 96–106)
Creatinine, Ser: 1.33 mg/dL — ABNORMAL HIGH (ref 0.57–1.00)
GFR calc Af Amer: 46 mL/min/{1.73_m2} — ABNORMAL LOW (ref >59)
GFR calc non Af Amer: 40 mL/min/{1.73_m2} — ABNORMAL LOW (ref >59)
Globulin, Total: 3.1 g/dL (ref 1.5–4.5)
Glucose: 92 mg/dL (ref 65–99)
Potassium: 4.7 mmol/L (ref 3.5–5.2)
Sodium: 140 mmol/L (ref 134–144)
Total Protein: 7.5 g/dL (ref 6.0–8.5)

## 2020-03-11 LAB — CBC
Hematocrit: 41.4 % (ref 34.0–46.6)
Hemoglobin: 14.1 g/dL (ref 11.1–15.9)
MCH: 31.5 pg (ref 26.6–33.0)
MCHC: 34.1 g/dL (ref 31.5–35.7)
MCV: 92 fL (ref 79–97)
Platelets: 261 10*3/uL (ref 150–450)
RBC: 4.48 x10E6/uL (ref 3.77–5.28)
RDW: 13.4 % (ref 11.7–15.4)
WBC: 6.2 10*3/uL (ref 3.4–10.8)

## 2020-03-11 LAB — HGB A1C W/O EAG: Hgb A1c MFr Bld: 5.9 % — ABNORMAL HIGH (ref 4.8–5.6)

## 2020-03-11 LAB — LIPID PANEL WITH LDL/HDL RATIO
Cholesterol, Total: 231 mg/dL — ABNORMAL HIGH (ref 100–199)
HDL: 71 mg/dL (ref >39)
LDL Chol Calc (NIH): 141 mg/dL — ABNORMAL HIGH (ref 0–99)
LDL/HDL Ratio: 2 ratio (ref 0.0–3.2)
Triglycerides: 110 mg/dL (ref 0–149)
VLDL Cholesterol Cal: 19 mg/dL (ref 5–40)

## 2020-03-11 LAB — VITAMIN D 25 HYDROXY (VIT D DEFICIENCY, FRACTURES): Vit D, 25-Hydroxy: 30.3 ng/mL (ref 30.0–100.0)

## 2020-03-11 LAB — TSH: TSH: 1.69 u[IU]/mL (ref 0.450–4.500)

## 2020-03-11 LAB — HIV ANTIBODY (ROUTINE TESTING W REFLEX): HIV Screen 4th Generation wRfx: NONREACTIVE

## 2020-03-11 LAB — T4, FREE: Free T4: 1.19 ng/dL (ref 0.82–1.77)

## 2020-03-11 NOTE — Progress Notes (Signed)
Improved renal functions. Other labs look good. Discuss with patient at next in-office visit.

## 2020-03-16 DIAGNOSIS — Z1231 Encounter for screening mammogram for malignant neoplasm of breast: Secondary | ICD-10-CM | POA: Insufficient documentation

## 2020-03-18 ENCOUNTER — Other Ambulatory Visit: Payer: Self-pay | Admitting: Nurse Practitioner

## 2020-03-18 DIAGNOSIS — Z1231 Encounter for screening mammogram for malignant neoplasm of breast: Secondary | ICD-10-CM

## 2020-04-13 ENCOUNTER — Other Ambulatory Visit: Payer: Self-pay

## 2020-04-13 ENCOUNTER — Ambulatory Visit
Admission: RE | Admit: 2020-04-13 | Discharge: 2020-04-13 | Disposition: A | Discharge status: Home / Self Care | Payer: Medicare HMO | Source: Ambulatory Visit | Attending: Nurse Practitioner | Admitting: Nurse Practitioner

## 2020-04-13 DIAGNOSIS — Z1231 Encounter for screening mammogram for malignant neoplasm of breast: Secondary | ICD-10-CM | POA: Insufficient documentation

## 2020-06-22 ENCOUNTER — Ambulatory Visit (INDEPENDENT_AMBULATORY_CARE_PROVIDER_SITE_OTHER): Payer: Medicare HMO | Admitting: Nurse Practitioner

## 2020-06-22 ENCOUNTER — Other Ambulatory Visit: Payer: Self-pay

## 2020-06-22 ENCOUNTER — Encounter: Payer: Self-pay | Admitting: Nurse Practitioner

## 2020-06-22 VITALS — BP 134/74 | HR 53 | Temp 97.2°F | Resp 16 | Ht 65.0 in | Wt 168.2 lb

## 2020-06-22 DIAGNOSIS — N390 Urinary tract infection, site not specified: Secondary | ICD-10-CM | POA: Diagnosis not present

## 2020-06-22 DIAGNOSIS — R319 Hematuria, unspecified: Secondary | ICD-10-CM

## 2020-06-22 DIAGNOSIS — R300 Dysuria: Secondary | ICD-10-CM | POA: Diagnosis not present

## 2020-06-22 DIAGNOSIS — M545 Low back pain, unspecified: Secondary | ICD-10-CM

## 2020-06-22 LAB — POCT URINALYSIS DIPSTICK
Bilirubin, UA: NEGATIVE
Glucose, UA: NEGATIVE
Leukocytes, UA: NEGATIVE
Nitrite, UA: NEGATIVE
Protein, UA: POSITIVE — AB
Spec Grav, UA: 1.025 (ref 1.010–1.025)
Urobilinogen, UA: NEGATIVE E.U./dL — AB
pH, UA: 5 (ref 5.0–8.0)

## 2020-06-22 MED ORDER — SULFAMETHOXAZOLE-TRIMETHOPRIM 400-80 MG PO TABS: 1.0000 | ORAL_TABLET | Freq: Two times a day (BID) | ORAL | 0 refills | Status: DC

## 2020-06-22 MED ORDER — IBUPROFEN 600 MG PO TABS: 600.0000 mg | ORAL_TABLET | Freq: Two times a day (BID) | ORAL | 1 refills | Status: DC | PRN

## 2020-06-22 NOTE — Progress Notes (Signed)
[facility] [place], [place] [address]  Internal MEDICINE  Office Visit Note  Patient Name: Lisa Thompson  [id number]  [id number]  Date of Service: 06/22/2020   Pt is here for a sick visit.  Chief Complaint  Patient presents with  . Acute Visit    4 mornings ago pt woke up with left lowerback pain down her left leg, pt not sleeping through the night, left leg hurts when pt walks sometimes, upper left leg numb, left eye keeps watering  . Hypertension  . policy update form    reveived  . Quality Metric Gaps    HepC     The patient is here for acute visit. The patient states that she has been unable to sleep for several days due to left flank pain which is radiating down to the left buttock and into the posterior left upper leg. She denies abdominal pain or pelvic pain. States that she has not taken anything to help with pain. She denies nausea or vomiting. States that pain has been so bad at times, especially in the mornings, she can hardly stand up.        Current Medication:  Outpatient Encounter Medications as of 06/22/2020  Medication Sig  . amLODipine (NORVASC) 5 MG tablet TAKE 1 TABLET BY MOUTH AT BEDTIME FOR BLOOD PRESSURE  . bisoprolol (ZEBETA) 5 MG tablet Take 1 tablet (5 mg total) by mouth daily.  . Multiple Vitamins-Minerals (CENTRUM SILVER ADULT 50+ PO) Take by mouth daily.  Marland Kitchen ibuprofen (ADVIL) 600 MG tablet Take 1 tablet (600 mg total) by mouth 2 (two) times daily as needed.  . sulfamethoxazole-trimethoprim (BACTRIM) 400-80 MG tablet Take 1 tablet by mouth 2 (two) times daily.  . [DISCONTINUED] diphenhydrAMINE HCl (BENADRYL ALLERGY PO) Take by mouth. (Patient not taking: Reported on 06/22/2020)  . [DISCONTINUED] pneumococcal 23 valent vaccine (PNEUMOVAX 23) 25 MCG/0.5ML injection Inject 0.40ml IM once (Patient not taking: Reported on 06/22/2020)  . [DISCONTINUED] Polyethyl Glycol-Propyl Glycol (SYSTANE OP) Apply to eye as needed.  (Patient not taking: Reported on 06/22/2020)  . [DISCONTINUED] traZODone (DESYREL) 50 MG tablet Take 1/2 to 1 tablet po QHS prn insomnia (Patient not taking: Reported on 06/22/2020)   No facility-administered encounter medications on file as of 06/22/2020.      Medical History: Past Medical History:  Diagnosis Date  . Arthritis    lower back  . Hypertension   . Motion sickness      Today's Vitals   06/22/20 0938  BP: 134/74  Pulse: (!) 53  Resp: 16  Temp: (!) 97.2 F (36.2 C)  SpO2: 99%  Weight: 168 lb 3.2 oz (76.3 kg)  Height: 5\' 5"  (1.651 m)   Body mass index is 27.99 kg/m.  Review of Systems  Constitutional: Negative for activity change, chills, fatigue and unexpected weight change.  HENT: Negative for congestion, postnasal drip, rhinorrhea, sneezing and sore throat.   Respiratory: Negative for cough, chest tightness, shortness of breath and wheezing.   Cardiovascular: Negative for chest pain and palpitations.  Gastrointestinal: Negative for abdominal pain, constipation, diarrhea, nausea and vomiting.  Genitourinary: Positive for flank pain. Negative for dysuria and frequency.  Musculoskeletal: Positive for back pain and myalgias. Negative for arthralgias, joint swelling and neck pain.       Left buttock pain radiating into posterior left upper leg.   Skin: Negative for rash.  Allergic/Immunologic: Negative for environmental allergies.  Neurological: Negative for dizziness, tremors, numbness and headaches.  Hematological: Negative for  adenopathy. Does not bruise/bleed easily.  Psychiatric/Behavioral: Negative for behavioral problems (Depression), sleep disturbance and suicidal ideas. The patient is not nervous/anxious.     Physical Exam Vitals and nursing note reviewed.  Constitutional:      General: She is not in acute distress.    Appearance: Normal appearance. She is well-developed. She is not diaphoretic.  HENT:     Head: Normocephalic and atraumatic.      Mouth/Throat:     Pharynx: No oropharyngeal exudate.  Eyes:     Pupils: Pupils are equal, round, and reactive to light.  Neck:     Thyroid: No thyromegaly.     Vascular: No JVD.     Trachea: No tracheal deviation.  Cardiovascular:     Rate and Rhythm: Normal rate and regular rhythm.     Heart sounds: Normal heart sounds. No murmur heard.  No friction rub. No gallop.   Pulmonary:     Effort: Pulmonary effort is normal. No respiratory distress.     Breath sounds: Normal breath sounds. No wheezing or rales.  Chest:     Chest wall: No tenderness.  Abdominal:     Palpations: Abdomen is soft.  Genitourinary:    Comments: Urine sample is positive for moderate blood and positive protein.  Musculoskeletal:        General: Normal range of motion.     Cervical back: Normal range of motion and neck supple.     Comments: Pain in left buttock and radiating down into left posterior upper leg. Sitting or standing for long periods of time cause increased pain. Patient is guarded when changing seated position and when going to standing position from seated.   Lymphadenopathy:     Cervical: No cervical adenopathy.  Skin:    General: Skin is warm and dry.  Neurological:     General: No focal deficit present.     Mental Status: She is alert and oriented to person, place, and time.     Cranial Nerves: No cranial nerve deficit.  Psychiatric:        Behavior: Behavior normal.        Thought Content: Thought content normal.        Judgment: Judgment normal.    Assessment/Plan: 1. Urinary tract infection with hematuria, site unspecified Start single strength bactrim twice daily for next 10 days. Send urine for culture and sensitivity and adjust antibiotics as indicated.  - sulfamethoxazole-trimethoprim (BACTRIM) 400-80 MG tablet; Take 1 tablet by mouth 2 (two) times daily.  Dispense: 20 tablet; Refill: 0  2. Low back pain, unspecified back pain laterality, unspecified chronicity, unspecified  whether sciatica present Suspect uti. Treat with antibiotics. Add ibuprofen twice daily as needed for pain/inflammation. Patient instructed to notify the office within twi to three days if pain no better. She voiced understanding and agreement with this plan.  - POCT Urinalysis Dipstick - CULTURE, URINE COMPREHENSIVE - ibuprofen (ADVIL) 600 MG tablet; Take 1 tablet (600 mg total) by mouth 2 (two) times daily as needed.  Dispense: 45 tablet; Refill: 1  3. Dysuria - CULTURE, URINE COMPREHENSIVE  General Counseling: Katonya verbalizes understanding of the findings of todays visit and agrees with plan of treatment. I have discussed any further diagnostic evaluation that may be needed or ordered today. We also reviewed her medications today. she has been encouraged to call the office with any questions or concerns that should arise related to todays visit.    Counseling:  This patient was seen by Nira Conn  Arroyo with Dr Lavera Guise as a part of collaborative care agreement  Orders Placed This Encounter  Procedures  . CULTURE, URINE COMPREHENSIVE  . POCT Urinalysis Dipstick    Meds ordered this encounter  Medications  . sulfamethoxazole-trimethoprim (BACTRIM) 400-80 MG tablet    Sig: Take 1 tablet by mouth 2 (two) times daily.    Dispense:  20 tablet    Refill:  0    Order Specific Question:   Supervising Provider    Answer:   Lavera Guise [[id number]]  . ibuprofen (ADVIL) 600 MG tablet    Sig: Take 1 tablet (600 mg total) by mouth 2 (two) times daily as needed.    Dispense:  45 tablet    Refill:  1    Order Specific Question:   Supervising Provider    Answer:   Lavera Guise [[id number]]    Time spent: 25 Minutes

## 2020-06-24 NOTE — Progress Notes (Signed)
Patient started on bactrim at time of visit.

## 2020-06-25 LAB — CULTURE, URINE COMPREHENSIVE

## 2020-06-29 ENCOUNTER — Other Ambulatory Visit: Payer: Self-pay

## 2020-06-29 DIAGNOSIS — I10 Essential (primary) hypertension: Secondary | ICD-10-CM

## 2020-06-29 MED ORDER — BISOPROLOL FUMARATE 5 MG PO TABS: 5.0000 mg | ORAL_TABLET | Freq: Every day | ORAL | 1 refills | Status: DC

## 2020-06-29 MED ORDER — AMLODIPINE BESYLATE 5 MG PO TABS: ORAL_TABLET | ORAL | 1 refills | Status: DC

## 2020-07-16 ENCOUNTER — Ambulatory Visit (INDEPENDENT_AMBULATORY_CARE_PROVIDER_SITE_OTHER): Payer: Medicare HMO | Admitting: Internal Medicine

## 2020-07-16 ENCOUNTER — Other Ambulatory Visit: Payer: Self-pay

## 2020-07-16 VITALS — BP 132/64 | HR 60 | Temp 97.2°F | Resp 16 | Ht 65.0 in | Wt 170.4 lb

## 2020-07-16 DIAGNOSIS — I6523 Occlusion and stenosis of bilateral carotid arteries: Secondary | ICD-10-CM

## 2020-07-16 DIAGNOSIS — H6123 Impacted cerumen, bilateral: Secondary | ICD-10-CM | POA: Diagnosis not present

## 2020-07-16 DIAGNOSIS — E782 Mixed hyperlipidemia: Secondary | ICD-10-CM | POA: Diagnosis not present

## 2020-07-16 DIAGNOSIS — I10 Essential (primary) hypertension: Secondary | ICD-10-CM

## 2020-07-16 DIAGNOSIS — I129 Hypertensive chronic kidney disease with stage 1 through stage 4 chronic kidney disease, or unspecified chronic kidney disease: Secondary | ICD-10-CM | POA: Diagnosis not present

## 2020-07-16 NOTE — Progress Notes (Signed)
[facility],  [address]  Internal MEDICINE  Office Visit Note  Patient Name: Lisa Thompson  [id number]  [id number]  Date of Service: 07/22/2020  Chief Complaint  Patient presents with  . Acute Visit    ear cleaning needed, right ear worse than left  . Hypertension  . policy update form    received    HPI Pt is here with c/o hearing problems. She thinks there is wax in both ears. BP is under better control, does have chronic kidney disease due to HTN, renal u/s did show renal cyst and smaller kidney size than normal. Mild carotid disease in 2013, will need repeat study  Mildly elevated glucose with hg a1c below 6.0 not on any medical therapy. Abnormal lipid profile as well Need to get colonoscopy report   Current Medication: Outpatient Encounter Medications as of 07/16/2020  Medication Sig  . amLODipine (NORVASC) 5 MG tablet TAKE 1 TABLET BY MOUTH AT BEDTIME FOR BLOOD PRESSURE  . amoxicillin-clavulanate (AUGMENTIN) 875-125 MG tablet Take 1 tablet by mouth 2 (two) times daily. For 10 days  . bisoprolol (ZEBETA) 5 MG tablet Take 1 tablet (5 mg total) by mouth daily.  . Multiple Vitamins-Minerals (CENTRUM SILVER ADULT 50+ PO) Take by mouth daily.  Marland Kitchen neomycin-colistin-hydrocortisone-thonzonium (CORTISPORIN-TC) 3.11-12-08-0.5 MG/ML OTIC suspension Instill 5 drops in both ears twice a day for 5 days  . [DISCONTINUED] ibuprofen (ADVIL) 600 MG tablet Take 1 tablet (600 mg total) by mouth 2 (two) times daily as needed.  . [DISCONTINUED] sulfamethoxazole-trimethoprim (BACTRIM) 400-80 MG tablet Take 1 tablet by mouth 2 (two) times daily.   No facility-administered encounter medications on file as of 07/16/2020.    Surgical History: Past Surgical History:  Procedure Laterality Date  . ABDOMINAL HYSTERECTOMY    . CATARACT EXTRACTION W/PHACO Left 12/06/2016   Procedure: CATARACT EXTRACTION PHACO AND INTRAOCULAR LENS PLACEMENT (IOC);  Surgeon: Eulogio Bear, MD;  Location: [place];  Service: Ophthalmology;  Laterality: Left;  IVA Topical LEFT LATEX allergy  . CATARACT EXTRACTION W/PHACO Right 01/03/2017   Procedure: CATARACT EXTRACTION PHACO AND INTRAOCULAR LENS PLACEMENT ([place]) RIGHT;  Surgeon: Eulogio Bear, MD;  Location: [place];  Service: Ophthalmology;  Laterality: Right;  IVA TOPICAL latex sensitivity  . CYST EXCISION     multiple. in different areas  . FOOT SURGERY  1997, 2007  . TONSILLECTOMY      Medical History: Past Medical History:  Diagnosis Date  . Arthritis    lower back  . Hypertension   . Motion sickness     Family History: Family History  Problem Relation Age of Onset  . Stroke Son   . Breast cancer Neg Hx     Social History   Socioeconomic History  . Marital status: Single    Spouse name: Not on file  . Number of children: Not on file  . Years of education: Not on file  . Highest education level: Not on file  Occupational History  . Not on file  Tobacco Use  . Smoking status: Never Smoker  . Smokeless tobacco: Never Used  Vaping Use  . Vaping Use: Never used  Substance and Sexual Activity  . Alcohol use: Not Currently  . Drug use: Never  . Sexual activity: Not on file  Other Topics Concern  . Not on file  Social History Narrative  . Not on file   Social Determinants of Health   Financial Resource Strain:   .  Difficulty of Paying Living Expenses: Not on file  Food Insecurity:   . Worried About Charity fundraiser in the Last Year: Not on file  . Ran Out of Food in the Last Year: Not on file  Transportation Needs:   . Lack of Transportation (Medical): Not on file  . Lack of Transportation (Non-Medical): Not on file  Physical Activity:   . Days of Exercise per Week: Not on file  . Minutes of Exercise per Session: Not on file  Stress:   . Feeling of Stress : Not on file  Social Connections:   . Frequency of Communication with Friends and Family: Not  on file  . Frequency of Social Gatherings with Friends and Family: Not on file  . Attends Religious Services: Not on file  . Active Member of Clubs or Organizations: Not on file  . Attends Archivist Meetings: Not on file  . Marital Status: Not on file  Intimate Partner Violence:   . Fear of Current or Ex-Partner: Not on file  . Emotionally Abused: Not on file  . Physically Abused: Not on file  . Sexually Abused: Not on file      Review of Systems  Constitutional: Negative for chills, diaphoresis and fatigue.  HENT: Positive for ear discharge and ear pain. Negative for postnasal drip and sinus pressure.   Eyes: Negative for photophobia, discharge, redness, itching and visual disturbance.  Respiratory: Negative for cough, shortness of breath and wheezing.   Cardiovascular: Negative for chest pain, palpitations and leg swelling.  Gastrointestinal: Negative for abdominal pain, constipation, diarrhea, nausea and vomiting.  Genitourinary: Negative for dysuria and flank pain.  Musculoskeletal: Negative for arthralgias, back pain, gait problem and neck pain.  Skin: Negative for color change.  Allergic/Immunologic: Negative for environmental allergies and food allergies.  Neurological: Negative for dizziness and headaches.  Hematological: Does not bruise/bleed easily.  Psychiatric/Behavioral: Negative for agitation, behavioral problems (depression) and hallucinations.    Vital Signs: BP 132/64   Pulse 60   Temp (!) 97.2 F (36.2 C)   Resp 16   Ht 5\' 5"  (1.651 m)   Wt 170 lb 6.4 oz (77.3 kg)   SpO2 98%   BMI 28.36 kg/m    Physical Exam Constitutional:      General: She is not in acute distress.    Appearance: She is well-developed. She is not diaphoretic.  HENT:     Head: Normocephalic and atraumatic.     Right Ear: There is impacted cerumen.     Left Ear: There is impacted cerumen.     Mouth/Throat:     Pharynx: No oropharyngeal exudate.  Eyes:     Pupils:  Pupils are equal, round, and reactive to light.  Neck:     Thyroid: No thyromegaly.     Vascular: No JVD.     Trachea: No tracheal deviation.  Cardiovascular:     Rate and Rhythm: Normal rate and regular rhythm.     Heart sounds: Normal heart sounds. No murmur heard.  No friction rub. No gallop.   Pulmonary:     Effort: Pulmonary effort is normal. No respiratory distress.     Breath sounds: No wheezing or rales.  Chest:     Chest wall: No tenderness.  Abdominal:     General: Bowel sounds are normal.     Palpations: Abdomen is soft.  Musculoskeletal:        General: Normal range of motion.     Cervical back:  Normal range of motion and neck supple.  Lymphadenopathy:     Cervical: No cervical adenopathy.  Skin:    General: Skin is warm and dry.  Neurological:     Mental Status: She is alert and oriented to person, place, and time.     Cranial Nerves: No cranial nerve deficit.  Psychiatric:        Behavior: Behavior normal.        Thought Content: Thought content normal.        Judgment: Judgment normal.     Assessment/Plan: 1. Bilateral impacted cerumen After ear lavage, auditory canal is edematous, has erythema start Augmentin, take as prescribed along with topical treatment  - Ear Lavage  2. Benign hypertension Continue all meds as before  3. Hypertensive kidney disease Needs aggressive bp control  4. 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 Mixed hyperlipidemia Cardiac risk factor modification:  1. Control blood pressure. 2. Exercise as prescribed. 3. Follow low sodium, low fat diet. and low fat and low cholestrol diet. Will start statin therapy  4. Take ASA 81mg  once a day. 5. Restricted calories diet to lose weight. General Counseling: Lisa Thompson verbalizes understanding of the findings of todays visit and agrees with plan of treatment. I have discussed any further diagnostic evaluation that  may be needed or ordered today. We also reviewed her medications today. she has been encouraged to call the office with any questions or concerns that should arise related to todays visit.  Orders Placed This Encounter  Procedures  . Ear Lavage   Orders Placed This Encounter  Procedures  . Ear Lavage    Meds ordered this encounter  Medications  . amoxicillin-clavulanate (AUGMENTIN) 875-125 MG tablet    Sig: Take 1 tablet by mouth 2 (two) times daily. For 10 days    Dispense:  20 tablet    Refill:  0  . neomycin-colistin-hydrocortisone-thonzonium (CORTISPORIN-TC) 3.11-12-08-0.5 MG/ML OTIC suspension    Sig: Instill 5 drops in both ears twice a day for 5 days    Dispense:  10 mL    Refill:  0    Total time spent:35 Minutes Time spent includes review of chart, medications, test results, and follow up plan with the patient.      Dr Lavera Guise Internal medicine

## 2020-07-17 MED ORDER — CORTISPORIN-TC 3.3-3-10-0.5 MG/ML OT SUSP: OTIC | 0 refills | Status: DC

## 2020-07-17 MED ORDER — AMOXICILLIN-POT CLAVULANATE 875-125 MG PO TABS: 1.0000 | ORAL_TABLET | Freq: Two times a day (BID) | ORAL | 0 refills | Status: DC

## 2020-07-22 ENCOUNTER — Encounter: Payer: Self-pay | Admitting: Internal Medicine

## 2020-09-08 ENCOUNTER — Encounter: Payer: Self-pay | Admitting: Nurse Practitioner

## 2020-09-08 ENCOUNTER — Other Ambulatory Visit: Payer: Self-pay

## 2020-09-08 ENCOUNTER — Ambulatory Visit (INDEPENDENT_AMBULATORY_CARE_PROVIDER_SITE_OTHER): Payer: Medicare HMO | Admitting: Nurse Practitioner

## 2020-09-08 VITALS — BP 123/70 | HR 69 | Temp 97.1°F | Resp 16 | Ht 65.0 in | Wt 165.4 lb

## 2020-09-08 DIAGNOSIS — I10 Essential (primary) hypertension: Secondary | ICD-10-CM

## 2020-09-08 DIAGNOSIS — N289 Disorder of kidney and ureter, unspecified: Secondary | ICD-10-CM | POA: Diagnosis not present

## 2020-09-08 DIAGNOSIS — G8929 Other chronic pain: Secondary | ICD-10-CM

## 2020-09-08 DIAGNOSIS — M545 Low back pain, unspecified: Secondary | ICD-10-CM | POA: Diagnosis not present

## 2020-09-08 NOTE — Progress Notes (Signed)
[facility] [address] [place], [place] [address]  Internal MEDICINE  Office Visit Note  Patient Name: Lisa Thompson  [id number]  [id number]  Date of Service: 09/08/2020  Chief Complaint  Patient presents with  . Follow-up  . Hypertension  . Nasal Congestion    This morning woke up with runny nose    The patient is here for follow up. She states that this morning, her nose started to run. She states that she does not feel bad. However, her partner has been ill with some sort of upper respiratory infection with bad cough, wheezing, and loss of appetite. The patient does not believe she has been exposed to anyone with COVID 19. The patient has had both of her COVID 19 vaccines and has had booster shot.  Her blood pressure is well managed.  She has had a five pound weight loss since her last visit.  Getting ready to go to [place] to visit family.  She got her Moderna COVID 19 vaccine 08/04/2020      Current Medication: Outpatient Encounter Medications as of 09/08/2020  Medication Sig  . amLODipine (NORVASC) 5 MG tablet TAKE 1 TABLET BY MOUTH AT BEDTIME FOR BLOOD PRESSURE  . bisoprolol (ZEBETA) 5 MG tablet Take 1 tablet (5 mg total) by mouth daily.  . Multiple Vitamins-Minerals (CENTRUM SILVER ADULT 50+ PO) Take by mouth daily.  Marland Kitchen. neomycin-colistin-hydrocortisone-thonzonium (CORTISPORIN-TC) 3.11-12-08-0.5 MG/ML OTIC suspension Instill 5 drops in both ears twice a day for 5 days  . [DISCONTINUED] amoxicillin-clavulanate (AUGMENTIN) 875-125 MG tablet Take 1 tablet by mouth 2 (two) times daily. For 10 days   No facility-administered encounter medications on file as of 09/08/2020.    Surgical History: Past Surgical History:  Procedure Laterality Date  . ABDOMINAL HYSTERECTOMY    . CATARACT EXTRACTION W/PHACO Left 12/06/2016   Procedure: CATARACT EXTRACTION PHACO AND INTRAOCULAR LENS PLACEMENT ([facility]);  Surgeon: Nevada CraneBradley Mark King, MD;  [facility];  Service: Ophthalmology;  Laterality: Left;  IVA Topical LEFT LATEX allergy  . CATARACT EXTRACTION W/PHACO Right 01/03/2017   Procedure: CATARACT EXTRACTION PHACO AND INTRAOCULAR LENS PLACEMENT ([facility]) RIGHT;  Surgeon: Nevada CraneBradley Mark King, MD;  [facility];  Service: Ophthalmology;  Laterality: Right;  IVA TOPICAL latex sensitivity  . CYST EXCISION     multiple. in different areas  . FOOT SURGERY  1997, 2007  . TONSILLECTOMY      Medical History: Past Medical History:  Diagnosis Date  . Arthritis    lower back  . Hypertension   . Motion sickness     Family History: Family History  Problem Relation Age of Onset  . Stroke Son   . Breast cancer Neg Hx     Social History   Socioeconomic History  . Marital status: Single    Spouse name: Not on file  . Number of children: Not on file  . Years of education: Not on file  . Highest education level: Not on file  Occupational History  . Not on file  Tobacco Use  . Smoking status: Never Smoker  . Smokeless tobacco: Never Used  Vaping Use  . Vaping Use: Never used  Substance and Sexual Activity  . Alcohol use: Not Currently  . Drug use: Never  . Sexual activity: Not on file  Other Topics Concern  . Not on file  Social History Narrative  . Not on file   Social Determinants of Health   Financial Resource Strain: Not on file  Food Insecurity: Not on file  Transportation Needs: Not on file  Physical Activity: Not on file  Stress: Not on file  Social Connections: Not on file  Intimate Partner Violence: Not on file      Review of Systems  Constitutional: Negative for activity change, chills, fatigue and unexpected weight change.  HENT: Positive for congestion. Negative for postnasal drip, rhinorrhea, sneezing and sore throat.   Respiratory: Negative for cough, chest tightness, shortness of breath and wheezing.   Cardiovascular: Negative for chest pain and palpitations.  Gastrointestinal: Negative  for abdominal pain, constipation, diarrhea, nausea and vomiting.  Genitourinary: Positive for flank pain. Negative for dysuria and frequency.  Musculoskeletal: Positive for back pain and myalgias. Negative for arthralgias, joint swelling and neck pain.       Intermittent low back pain. She does have prescription for ibuprofen which she can take when needed.   Skin: Negative for rash.  Allergic/Immunologic: Negative for environmental allergies.  Neurological: Negative for dizziness, tremors, numbness and headaches.  Hematological: Negative for adenopathy. Does not bruise/bleed easily.  Psychiatric/Behavioral: Negative for behavioral problems (Depression), sleep disturbance and suicidal ideas. The patient is not nervous/anxious.     Vital Signs: BP 123/70   Pulse 69   Temp (!) 97.1 F (36.2 C)   Resp 16   Ht 5\' 5"  (1.651 m)   Wt 165 lb 6.4 oz (75 kg)   SpO2 98%   BMI 27.52 kg/m    Physical Exam Vitals and nursing note reviewed.  Constitutional:      General: She is not in acute distress.    Appearance: Normal appearance. She is well-developed. She is not diaphoretic.  HENT:     Head: Normocephalic and atraumatic.     Mouth/Throat:     Pharynx: No oropharyngeal exudate.  Eyes:     Pupils: Pupils are equal, round, and reactive to light.  Neck:     Thyroid: No thyromegaly.     Vascular: No JVD.     Trachea: No tracheal deviation.  Cardiovascular:     Rate and Rhythm: Normal rate and regular rhythm.     Heart sounds: Normal heart sounds. No murmur heard. No friction rub. No gallop.   Pulmonary:     Effort: Pulmonary effort is normal. No respiratory distress.     Breath sounds: Normal breath sounds. No wheezing or rales.  Chest:     Chest wall: No tenderness.  Abdominal:     Palpations: Abdomen is soft.  Genitourinary:    Comments: Urine sample is positive for moderate blood and positive protein.  Musculoskeletal:        General: Normal range of motion.     Cervical  back: Normal range of motion and neck supple.     Comments: Patient has lower back pain which is worse with bending and twisting at the waist. This is intermittent in nature. No bony abnormalities or deformities are noted.   Lymphadenopathy:     Cervical: No cervical adenopathy.  Skin:    General: Skin is warm and dry.  Neurological:     General: No focal deficit present.     Mental Status: She is alert and oriented to person, place, and time.     Cranial Nerves: No cranial nerve deficit.  Psychiatric:        Mood and Affect: Mood normal.        Behavior: Behavior normal.        Thought Content: Thought content normal.  Judgment: Judgment normal.     Assessment/Plan: 1. Benign hypertension Stable. Continue bp medication as prescribed   2. Chronic midline low back pain, unspecified whether sciatica present May take previously prescribed ibuprofen as needed and as prescribed. Recommend heating pad to reduce pain and inflammation.  Gentle stretching also encouraged   3. Abnormal renal function Stable. Continue to monitor.   General Counseling: Lisa Thompson verbalizes understanding of the findings of todays visit and agrees with plan of treatment. I have discussed any further diagnostic evaluation that may be needed or ordered today. We also reviewed her medications today. she has been encouraged to call the office with any questions or concerns that should arise related to todays visit.   This patient was seen by Vincent Gros FNP Collaboration with Dr Lyndon Code as a part of collaborative care agreement  Total time spent: 25 Minutes   Time spent includes review of chart, medications, test results, and follow up plan with the patient.      Dr Lyndon Code Internal medicine

## 2020-11-18 DIAGNOSIS — H905 Unspecified sensorineural hearing loss: Secondary | ICD-10-CM | POA: Diagnosis not present

## 2020-11-25 DIAGNOSIS — H469 Unspecified optic neuritis: Secondary | ICD-10-CM | POA: Diagnosis not present

## 2020-11-25 DIAGNOSIS — H40003 Preglaucoma, unspecified, bilateral: Secondary | ICD-10-CM | POA: Diagnosis not present

## 2020-12-05 IMAGING — MG DIGITAL SCREENING BILATERAL MAMMOGRAM WITH TOMO AND CAD
6 of 10 series · 6 of 30 positions shown · non-contrast
Comparison: Previous exam(s).

CLINICAL DATA: Screening.

EXAM:
DIGITAL SCREENING BILATERAL MAMMOGRAM WITH TOMO AND CAD

[R MLO synth-2D (1 of 2)]
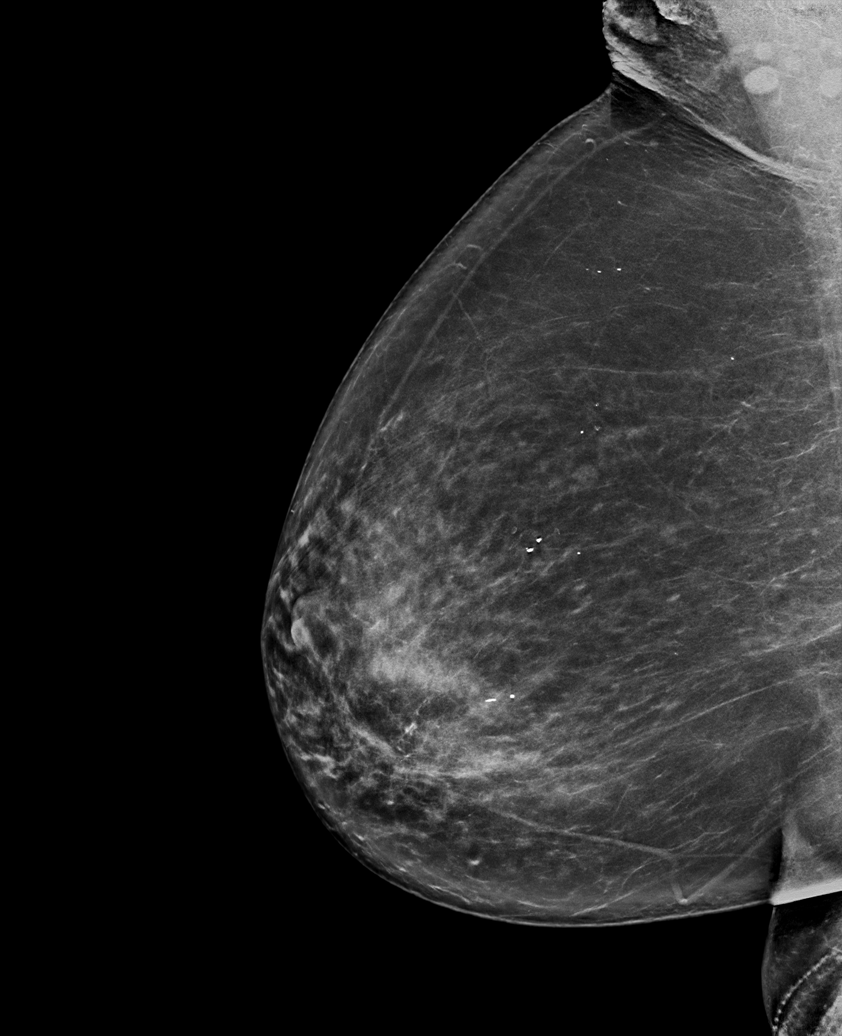

[R CC synth-2D]
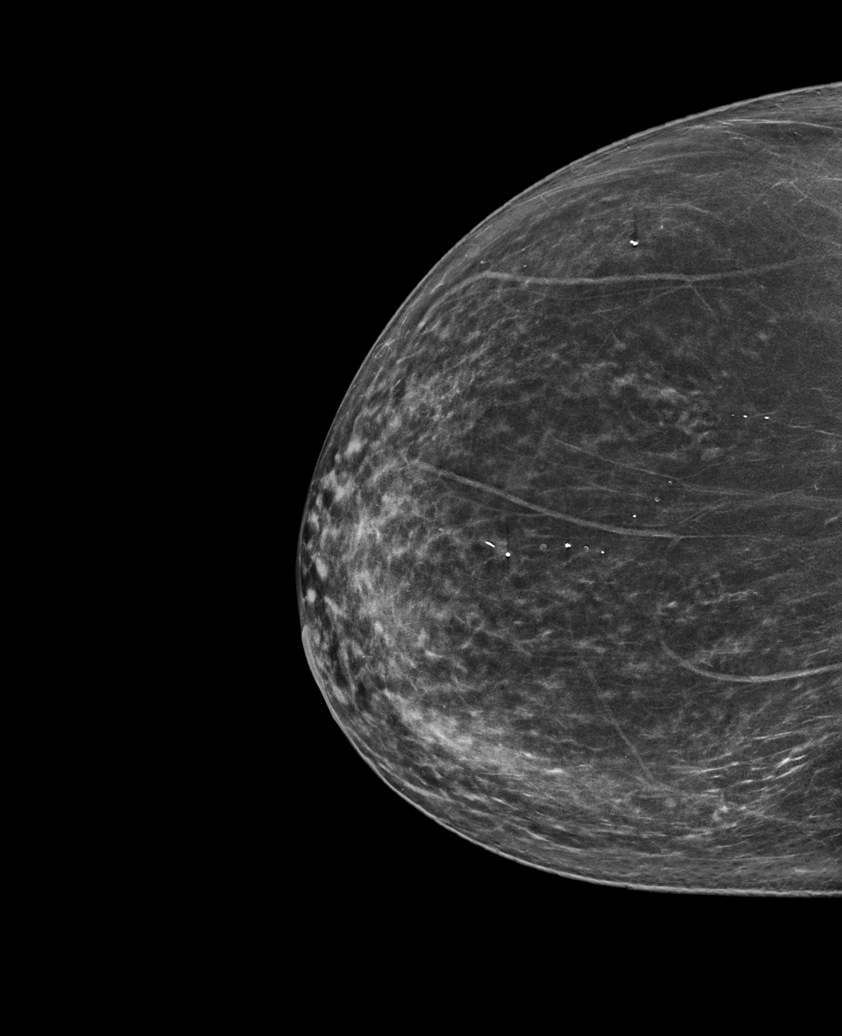

[R MLO synth-2D (2 of 2)]
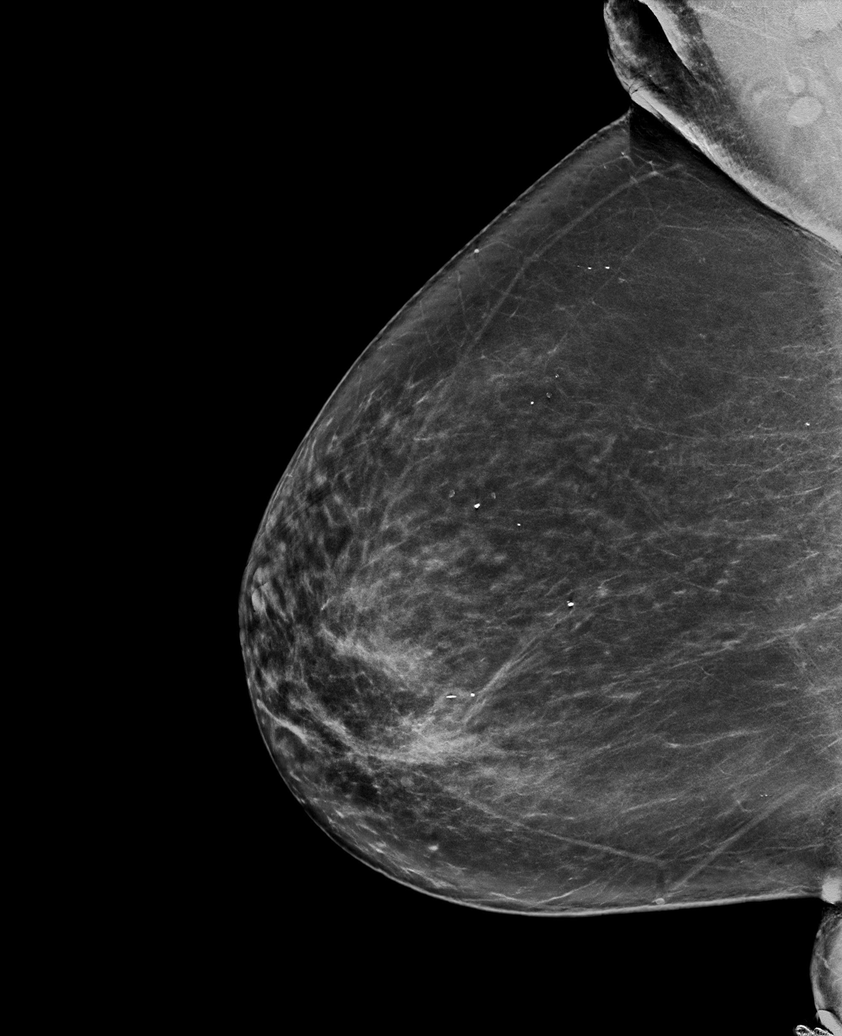

[L CC synth-2D]
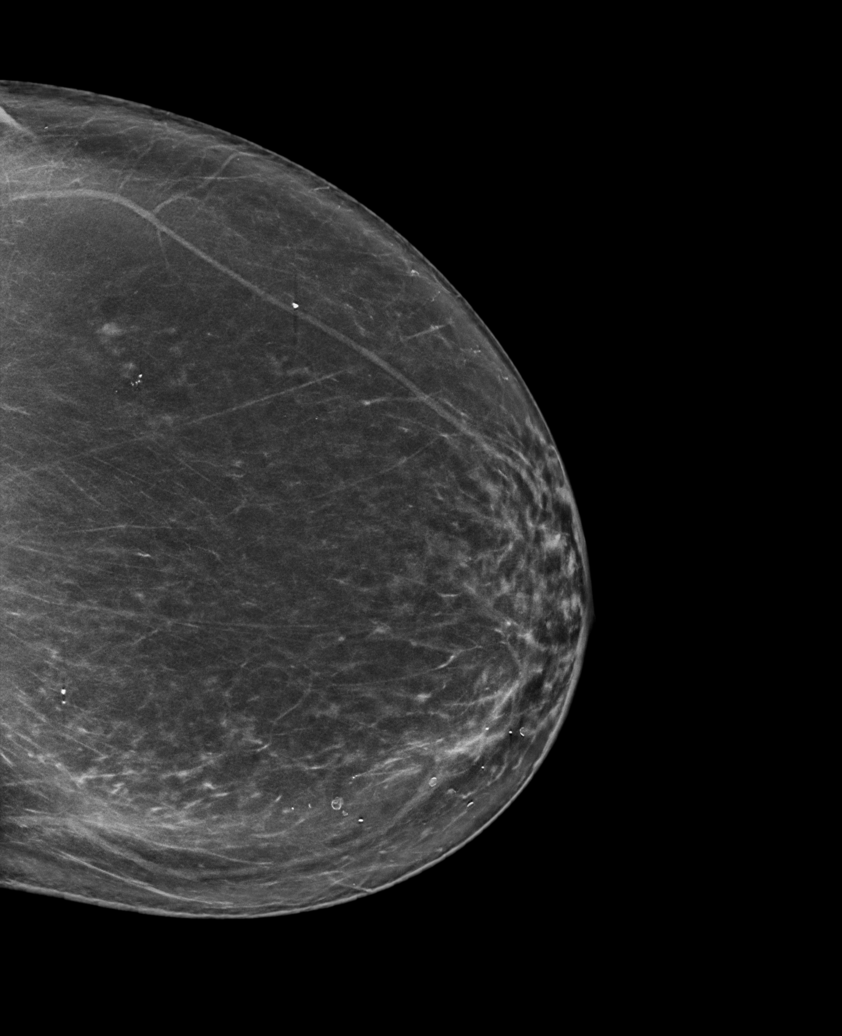

[L MLO synth-2D]
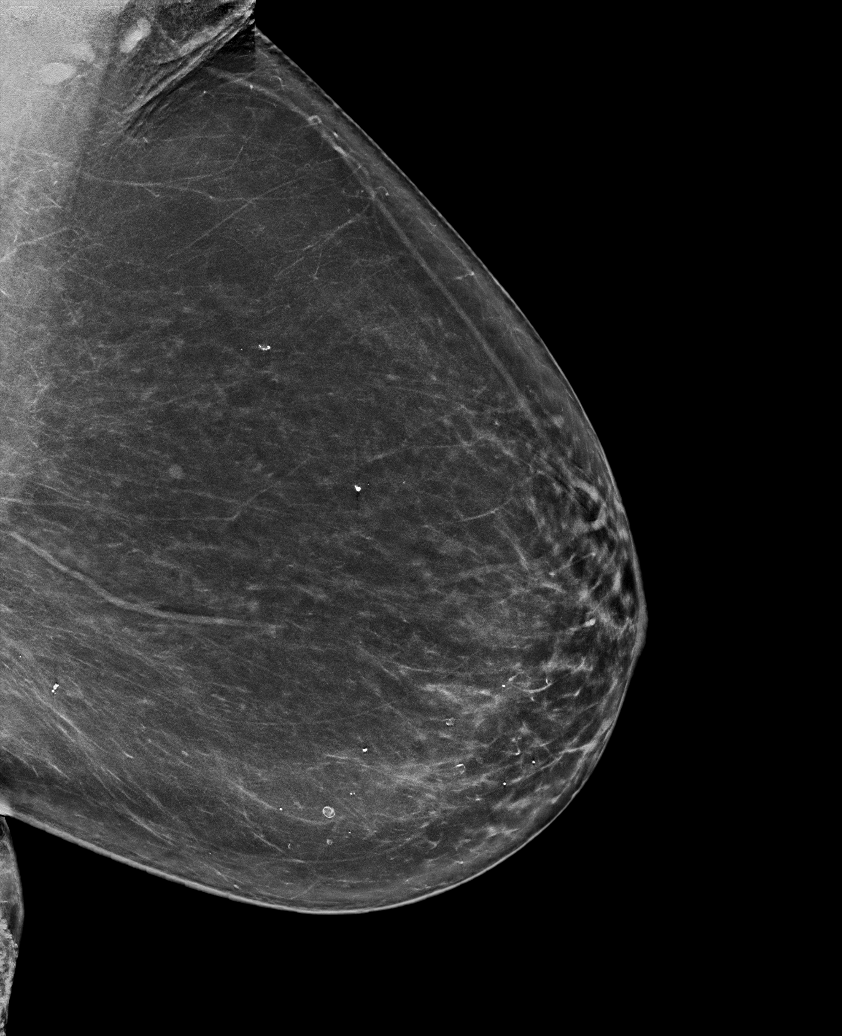

[L MLO tomo · tomo slice 41/80.0]
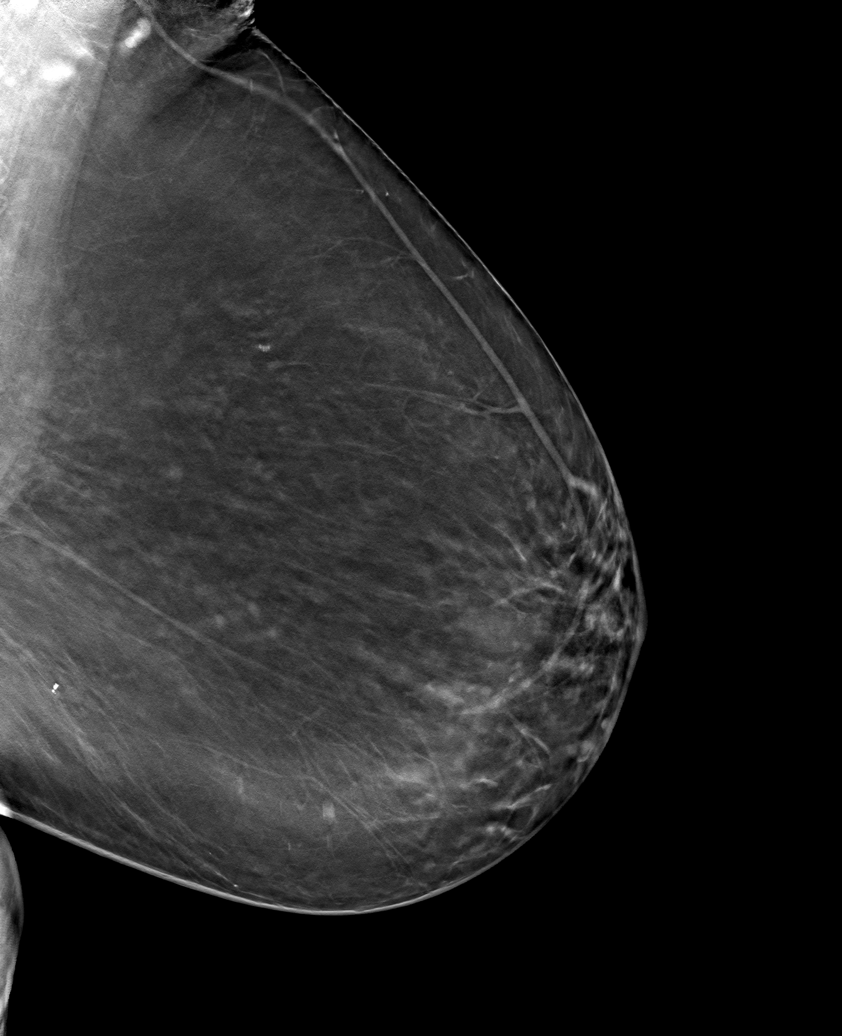

[6 of 30 positions shown; findings below may reference images not displayed]

ACR Breast Density Category b: There are scattered areas of
fibroglandular density.
FINDINGS: There are no findings suspicious for malignancy. Images were
processed with CAD.
IMPRESSION: No mammographic evidence of malignancy. A result letter of this
screening mammogram will be mailed directly to the patient.

RECOMMENDATION:
Screening mammogram in one year. (Code:[id number])

BI-RADS CATEGORY  1: Negative.

## 2020-12-29 ENCOUNTER — Ambulatory Visit: Payer: Medicare Other | Admitting: Physician Assistant

## 2020-12-29 ENCOUNTER — Other Ambulatory Visit: Payer: Self-pay

## 2020-12-29 VITALS — BP 135/70 | HR 61 | Temp 98.2°F | Resp 18 | Ht 65.0 in | Wt 167.0 lb

## 2020-12-29 DIAGNOSIS — M79672 Pain in left foot: Secondary | ICD-10-CM

## 2020-12-29 NOTE — Patient Instructions (Signed)
I started the referral for you to be seen by [facility] podiatry, please let us know if you do not hear from them in the next couple of weeks.  Please let us know if there is anything else we can do for you   Kennieth Rad, PA-C Physician Assistant [facility] Medicine [email or web address]    Foot Pain Many things can cause foot pain. Some common causes are:  An injury.  A sprain.  Arthritis.  Blisters.  Bunions. Follow these instructions at home: Managing pain, stiffness, and swelling If directed, put ice on the painful area:  Put ice in a plastic bag.  Place a towel between your skin and the bag.  Leave the ice on for 20 minutes, 2-3 times a day.   Activity  Do not stand or walk for long periods.  Return to your normal activities as told by your health care provider. Ask your health care provider what activities are safe for you.  Do stretches to relieve foot pain and stiffness as told by your health care provider.  Do not lift anything that is heavier than 10 lb (4.5 kg), or the limit that you are told, until your health care provider says that it is safe. Lifting a lot of weight can put added pressure on your feet. Lifestyle  Wear comfortable, supportive shoes that fit you well. Do not wear high heels.  Keep your feet clean and dry. General instructions  Take over-the-counter and prescription medicines only as told by your health care provider.  Rub your foot gently.  Pay attention to any changes in your symptoms.  Keep all follow-up visits as told by your health care provider. This is important. Contact a health care provider if:  Your pain does not get better after a few days of self-care.  Your pain gets worse.  You cannot stand on your foot. Get help right away if:  Your foot is numb or tingling.  Your foot or toes are swollen.  Your foot or toes turn white or blue.  You have warmth and  redness along your foot. Summary  Common causes of foot pain are injury, sprain, arthritis, blisters or bunions.  Ice, medicines, and comfortable shoes may help foot pain.  Contact your health care provider if your pain does not get better after a few days of self-care. This information is not intended to replace advice given to you by your health care provider. Make sure you discuss any questions you have with your health care provider. Document Revised: 06/14/2018 Document Reviewed: 06/14/2018 Elsevier Patient Education  2021 [employer].

## 2020-12-29 NOTE — Progress Notes (Signed)
Patient complains back pain being achy due to going to the laundry mat today. Patient complains of left leg pain with discomfort when she is walking.  Pain in the thigh has been present for 3-4 months.

## 2020-12-29 NOTE — Progress Notes (Signed)
New Patient Office Visit  Subjective:  Patient ID: Lisa Thompson, female    DOB: June 13, 1946  Age: 75 y.o. MRN: [id number]  CC:  Chief Complaint  Patient presents with  . left foot pain    HPI Lisa Thompson reports that she has been having left foot pain for the last 2 months.  Denies recent injury or trauma.  Endorses surgery on that foot approximately 10 years ago and repeat surgery a few years later.  Reports that she has been using Tylenol without much relief.  Does endorse that she wears proper fitting shoes.   Started hurting again two months ago - hurts on   Note from 10/16/14 Podiatry visit  Lisa Thompson is a 75 y.o. with a complaint of pain to the dorsal lateral aspect of her left foot. She points to the base of her incision at her 5th metatarsal. We performed a 5th metatarsal osteotomy in 2013. I believe the pin has become symptomatic for her at this time. The surgical site she states is doing extremely well and she is very pleased with her foot.        Past Medical History:  Diagnosis Date  . Arthritis    lower back  . Hypertension   . Motion sickness     Past Surgical History:  Procedure Laterality Date  . ABDOMINAL HYSTERECTOMY    . CATARACT EXTRACTION W/PHACO Left 12/06/2016   Procedure: CATARACT EXTRACTION PHACO AND INTRAOCULAR LENS PLACEMENT (IOC);  Surgeon: Eulogio Bear, MD;  Location: [place];  Service: Ophthalmology;  Laterality: Left;  IVA Topical LEFT LATEX allergy  . CATARACT EXTRACTION W/PHACO Right 01/03/2017   Procedure: CATARACT EXTRACTION PHACO AND INTRAOCULAR LENS PLACEMENT ([place]) RIGHT;  Surgeon: Eulogio Bear, MD;  Location: [place];  Service: Ophthalmology;  Laterality: Right;  IVA TOPICAL latex sensitivity  . CYST EXCISION     multiple. in different areas  . FOOT SURGERY  1997, 2007  . TONSILLECTOMY      Family History  Problem Relation Age of Onset  . Stroke Son   . Breast cancer Neg Hx      Social History   Socioeconomic History  . Marital status: Single    Spouse name: Not on file  . Number of children: Not on file  . Years of education: Not on file  . Highest education level: Not on file  Occupational History  . Not on file  Tobacco Use  . Smoking status: Never Smoker  . Smokeless tobacco: Never Used  Vaping Use  . Vaping Use: Never used  Substance and Sexual Activity  . Alcohol use: Not Currently  . Drug use: Never  . Sexual activity: Not Currently  Other Topics Concern  . Not on file  Social History Narrative  . Not on file   Social Determinants of Health   Financial Resource Strain: Not on file  Food Insecurity: Not on file  Transportation Needs: Not on file  Physical Activity: Not on file  Stress: Not on file  Social Connections: Not on file  Intimate Partner Violence: Not on file    ROS Review of Systems  Constitutional: Negative.   HENT: Negative.   Eyes: Negative.   Respiratory: Negative for shortness of breath.   Cardiovascular: Negative for chest pain.  Gastrointestinal: Negative.   Endocrine: Negative.   Genitourinary: Negative.   Musculoskeletal: Positive for gait problem.  Skin: Negative.   Allergic/Immunologic: Negative.   Hematological: Negative.   Psychiatric/Behavioral: Negative.  Objective:   Today's Vitals: BP 135/70 (BP Location: Left Arm, Patient Position: Sitting, Cuff Size: Normal)   Pulse 61   Temp 98.2 F (36.8 C) (Oral)   Resp 18   Ht 5\' 5"  (1.651 m)   Wt 167 lb (75.8 kg)   BMI 27.79 kg/m   Physical Exam Vitals and nursing note reviewed.  Constitutional:      Appearance: Normal appearance.  HENT:     Head: Normocephalic and atraumatic.     Right Ear: External ear normal.     Left Ear: External ear normal.     Nose: Nose normal.     Mouth/Throat:     Mouth: Mucous membranes are moist.     Pharynx: Oropharynx is clear.  Eyes:     Extraocular Movements: Extraocular movements intact.      Conjunctiva/sclera: Conjunctivae normal.     Pupils: Pupils are equal, round, and reactive to light.  Cardiovascular:     Rate and Rhythm: Normal rate and regular rhythm.     Pulses: Normal pulses.          Dorsalis pedis pulses are 2+ on the left side.       Posterior tibial pulses are 2+ on the left side.     Heart sounds: Normal heart sounds.  Pulmonary:     Effort: Pulmonary effort is normal.     Breath sounds: Normal breath sounds.  Musculoskeletal:        General: Normal range of motion.     Cervical back: Normal range of motion and neck supple.     Left foot: Normal range of motion. No deformity.  Feet:     Left foot:     Skin integrity: Skin integrity normal.  Skin:    General: Skin is warm and dry.  Neurological:     General: No focal deficit present.     Mental Status: She is alert and oriented to person, place, and time.  Psychiatric:        Mood and Affect: Mood normal.        Behavior: Behavior normal.        Thought Content: Thought content normal.        Judgment: Judgment normal.     Assessment & Plan:   Problem List Items Addressed This Visit      Other   Left foot pain - Primary   Relevant Orders   Ambulatory referral to Podiatry      Outpatient Encounter Medications as of 12/29/2020  Medication Sig  . amLODipine (NORVASC) 5 MG tablet TAKE 1 TABLET BY MOUTH AT BEDTIME FOR BLOOD PRESSURE  . bisoprolol (ZEBETA) 5 MG tablet Take 1 tablet (5 mg total) by mouth daily.  . Multiple Vitamins-Minerals (CENTRUM SILVER ADULT 50+ PO) Take by mouth daily.  Marland Kitchen neomycin-colistin-hydrocortisone-thonzonium (CORTISPORIN-TC) 3.11-12-08-0.5 MG/ML OTIC suspension Instill 5 drops in both ears twice a day for 5 days   No facility-administered encounter medications on file as of 12/29/2020.  1. Left foot pain Patient education given on symptomatic treatment, refer back to her previous Psychologist, sport and exercise.  Red flags given for prompt reevaluation  - Ambulatory referral to Podiatry  I  have reviewed the patient's medical history (PMH, PSH, Social History, Family History, Medications, and allergies) , and have been updated if relevant. I spent 22 minutes reviewing chart and  face to face time with patient.     Follow-up: Return if symptoms worsen or fail to improve.   Loraine Grip Mayers, PA-C

## 2021-01-12 DIAGNOSIS — M7672 Peroneal tendinitis, left leg: Secondary | ICD-10-CM | POA: Diagnosis not present

## 2021-01-12 DIAGNOSIS — M79672 Pain in left foot: Secondary | ICD-10-CM | POA: Diagnosis not present

## 2021-02-14 ENCOUNTER — Other Ambulatory Visit: Payer: Self-pay

## 2021-02-14 ENCOUNTER — Encounter: Payer: Self-pay | Admitting: Emergency Medicine

## 2021-02-14 ENCOUNTER — Emergency Department
Admission: EM | Admit: 2021-02-14 | Discharge: 2021-02-14 | Disposition: A | Discharge status: Home / Self Care | Payer: Medicare Other | Attending: Emergency Medicine | Admitting: Emergency Medicine

## 2021-02-14 DIAGNOSIS — I10 Essential (primary) hypertension: Secondary | ICD-10-CM | POA: Diagnosis not present

## 2021-02-14 DIAGNOSIS — Z79899 Other long term (current) drug therapy: Secondary | ICD-10-CM | POA: Insufficient documentation

## 2021-02-14 DIAGNOSIS — Z91040 Latex allergy status: Secondary | ICD-10-CM | POA: Diagnosis not present

## 2021-02-14 DIAGNOSIS — M5432 Sciatica, left side: Secondary | ICD-10-CM | POA: Diagnosis not present

## 2021-02-14 DIAGNOSIS — M25552 Pain in left hip: Secondary | ICD-10-CM | POA: Diagnosis present

## 2021-02-14 LAB — CBC WITH DIFFERENTIAL/PLATELET
Abs Immature Granulocytes: 0.01 10*3/uL (ref 0.00–0.07)
Basophils Absolute: 0 10*3/uL (ref 0.0–0.1)
Basophils Relative: 1 %
Eosinophils Absolute: 0.2 10*3/uL (ref 0.0–0.5)
Eosinophils Relative: 3 %
HCT: 41 % (ref 36.0–46.0)
Hemoglobin: 13.5 g/dL (ref 12.0–15.0)
Immature Granulocytes: 0 %
Lymphocytes Relative: 29 %
Lymphs Abs: 1.7 10*3/uL (ref 0.7–4.0)
MCH: 31.3 pg (ref 26.0–34.0)
MCHC: 32.9 g/dL (ref 30.0–36.0)
MCV: 95.1 fL (ref 80.0–100.0)
Monocytes Absolute: 0.5 10*3/uL (ref 0.1–1.0)
Monocytes Relative: 9 %
Neutro Abs: 3.5 10*3/uL (ref 1.7–7.7)
Neutrophils Relative %: 58 %
Platelets: 250 10*3/uL (ref 150–400)
RBC: 4.31 MIL/uL (ref 3.87–5.11)
RDW: 14.4 % (ref 11.5–15.5)
WBC: 5.9 10*3/uL (ref 4.0–10.5)
nRBC: 0 % (ref 0.0–0.2)

## 2021-02-14 LAB — BASIC METABOLIC PANEL
Anion gap: 10 (ref 5–15)
BUN: 14 mg/dL (ref 8–23)
CO2: 24 mmol/L (ref 22–32)
Calcium: 8.8 mg/dL — ABNORMAL LOW (ref 8.9–10.3)
Chloride: 105 mmol/L (ref 98–111)
Creatinine, Ser: 1.01 mg/dL — ABNORMAL HIGH (ref 0.44–1.00)
GFR, Estimated: 58 mL/min — ABNORMAL LOW (ref >60)
Glucose, Bld: 95 mg/dL (ref 70–99)
Potassium: 3.9 mmol/L (ref 3.5–5.1)
Sodium: 139 mmol/L (ref 135–145)

## 2021-02-14 LAB — URINALYSIS, COMPLETE (UACMP) WITH MICROSCOPIC
Bilirubin Urine: NEGATIVE
Glucose, UA: NEGATIVE mg/dL
Ketones, ur: NEGATIVE mg/dL
Leukocytes,Ua: NEGATIVE
Nitrite: NEGATIVE
Protein, ur: NEGATIVE mg/dL
Specific Gravity, Urine: 1.015 (ref 1.005–1.030)
pH: 7 (ref 5.0–8.0)

## 2021-02-14 MED ORDER — TRAMADOL HCL 50 MG PO TABS: 50.0000 mg | ORAL_TABLET | Freq: Four times a day (QID) | ORAL | 0 refills | Status: DC | PRN

## 2021-02-14 MED ORDER — PREDNISONE 10 MG (21) PO TBPK: ORAL_TABLET | ORAL | 0 refills | Status: DC

## 2021-02-14 NOTE — ED Notes (Signed)
First Nurse Note: Pt reports left hip pain and that she thinks her left kidney has "given out" because she was up peeing every hour last night. Pt is in NAD.

## 2021-02-14 NOTE — ED Provider Notes (Addendum)
[facility] Emergency Department Provider Note ____________________________________________   Event Date/Time   First MD Initiated Contact with Patient 02/14/21 1303     (approximate)  I have reviewed the triage vital signs and the nursing notes.   HISTORY  Chief Complaint Hip Pain and Urinary Frequency  HPI Lisa Thompson is a 75 y.o. female with history of lower back pain, hypertension and remaining history as listed below presents to the emergency department for treatment and evaluation of left lateral hip pain that radiates down the side of her leg toward the knee.  Pain started 4 days ago but seemed to be worse this morning so she decided to come to the emergency department.  She states that when she was a child, she had a "bad kidney" and was concerned that this may be the same.  No alleviating measures attempted prior to arrival..         Past Medical History:  Diagnosis Date  . Arthritis    lower back  . Hypertension   . Motion sickness     Patient Active Problem List   Diagnosis Date Noted  . Left foot pain 12/29/2020  . Low back pain 06/22/2020  . Encounter for screening mammogram for malignant neoplasm of breast 03/16/2020  . Primary insomnia 09/30/2019  . Other fatigue 09/30/2019  . 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 Abnormal renal function 04/26/2019  . Mixed hyperlipidemia 04/26/2019  . Degenerative disc disease, lumbar 03/03/2019  . Pruritic dermatitis 03/03/2019  . Urinary tract infection with hematuria 02/27/2018  . Dysuria 02/27/2018  . Encounter for general adult medical examination with abnormal findings 02/27/2018  . Vitamin D deficiency 02/27/2018  . Benign hypertension 10/30/2017  . Cervical disc disorder at C5-C6 level with radiculopathy 10/30/2017    Past Surgical History:  Procedure Laterality Date  . ABDOMINAL HYSTERECTOMY    . CATARACT EXTRACTION W/PHACO Left 12/06/2016   Procedure: CATARACT  EXTRACTION PHACO AND INTRAOCULAR LENS PLACEMENT ([facility]);  Surgeon: Eulogio Bear, MD;  Location: [place];  Service: Ophthalmology;  Laterality: Left;  IVA Topical LEFT LATEX allergy  . CATARACT EXTRACTION W/PHACO Right 01/03/2017   Procedure: CATARACT EXTRACTION PHACO AND INTRAOCULAR LENS PLACEMENT ([place]) RIGHT;  Surgeon: Eulogio Bear, MD;  Location: [place];  Service: Ophthalmology;  Laterality: Right;  IVA TOPICAL latex sensitivity  . CYST EXCISION     multiple. in different areas  . FOOT SURGERY  1997, 2007  . TONSILLECTOMY      Prior to Admission medications   Medication Sig Start Date End Date Taking? Authorizing Provider  predniSONE (STERAPRED UNI-PAK 21 TAB) 10 MG (21) TBPK tablet Take 6 tablets on the first day and decrease by 1 tablet each day until finished. 02/14/21  Yes Talina Pleitez B, FNP  traMADol (ULTRAM) 50 MG tablet Take 1 tablet (50 mg total) by mouth every 6 (six) hours as needed. 02/14/21  Yes Masoud Nyce B, FNP  amLODipine (NORVASC) 5 MG tablet TAKE 1 TABLET BY MOUTH AT BEDTIME FOR BLOOD PRESSURE 06/29/20   Ronnell Freshwater, NP  bisoprolol (ZEBETA) 5 MG tablet Take 1 tablet (5 mg total) by mouth daily. 06/29/20   Ronnell Freshwater, NP  Multiple Vitamins-Minerals (CENTRUM SILVER ADULT 50+ PO) Take by mouth daily.    [provider]  neomycin-colistin-hydrocortisone-thonzonium (CORTISPORIN-TC) 3.11-12-08-0.5 MG/ML OTIC suspension Instill 5 drops in both ears twice a day for 5 days 07/17/20   Lavera Guise, MD  Allergies Aspirin, Latex, Other, and Shellfish allergy  Family History  Problem Relation Age of Onset  . Stroke Son   . Breast cancer Neg Hx     Social History Social History   Tobacco Use  . Smoking status: Never Smoker  . Smokeless tobacco: Never Used  Vaping Use  . Vaping Use: Never used  Substance Use Topics  . Alcohol use: Not Currently  . Drug use: Never    Review of Systems  Constitutional: No  fever/chills Eyes: No visual changes. ENT: No sore throat. Cardiovascular: Denies chest pain. Respiratory: Denies shortness of breath. Gastrointestinal: No abdominal pain.  No nausea, no vomiting.  No diarrhea.  No constipation. Genitourinary: Negative for dysuria. Musculoskeletal: Negative for back pain.  Positive for left hip pain Skin: Negative for rash. Neurological: Negative for headaches, focal weakness or numbness. ____________________________________________   PHYSICAL EXAM:  VITAL SIGNS: ED Triage Vitals  Enc Vitals Group     BP 02/14/21 1026 (!) 153/95     Pulse Rate 02/14/21 1026 66     Resp 02/14/21 1026 16     Temp 02/14/21 1030 98.2 F (36.8 C)     Temp Source 02/14/21 1030 Oral     SpO2 02/14/21 1026 100 %     Weight 02/14/21 1027 167 lb (75.8 kg)     Height 02/14/21 1027 5\' 5"  (1.651 m)     Head Circumference --      Peak Flow --      Pain Score 02/14/21 1026 10     Pain Loc --      Pain Edu? --      Excl. in [place]? --     Constitutional: Alert and oriented. Well appearing and in no acute distress. Eyes: Conjunctivae are normal. PERRL. EOMI. Head: Atraumatic. Nose: No congestion/rhinnorhea. Mouth/Throat: Mucous membranes are moist.  Oropharynx non-erythematous. Neck: No stridor.   Hematological/Lymphatic/Immunilogical: No cervical lymphadenopathy. Cardiovascular: Normal rate, regular rhythm. Grossly normal heart sounds.  Good peripheral circulation. Respiratory: Normal respiratory effort.  No retractions. Lungs CTAB. Gastrointestinal: Soft and nontender. No distention. No abdominal bruits. No CVA tenderness. Genitourinary:  Musculoskeletal: Pain in the left hip starts mid buttock radiates around to the lateral aspect of the left leg.  Pain is worse with movement.  No pain with internal or external rotation of the hip. Neurologic:  Normal speech and language. No gross focal neurologic deficits are appreciated. No gait instability. Skin:  Skin is warm, dry  and intact. No rash noted. Psychiatric: Mood and affect are normal. Speech and behavior are normal.  ____________________________________________   LABS (all labs ordered are listed, but only abnormal results are displayed)  Labs Reviewed  URINALYSIS, COMPLETE (UACMP) WITH MICROSCOPIC - Abnormal; Notable for the following components:      Result Value   Color, Urine YELLOW (*)    APPearance HAZY (*)    Hgb urine dipstick MODERATE (*)    Bacteria, UA RARE (*)    All other components within normal limits  BASIC METABOLIC PANEL - Abnormal; Notable for the following components:   Creatinine, Ser 1.01 (*)    Calcium 8.8 (*)    GFR, Estimated 58 (*)    All other components within normal limits  CBC WITH DIFFERENTIAL/PLATELET   ____________________________________________  EKG  Not indicated ____________________________________________  RADIOLOGY  ED MD interpretation:    Not indicated I, Matilda Fleig, personally viewed and evaluated these images (plain radiographs) as part of my medical decision making, as well as reviewing  the written report by the radiologist.  Official radiology report(s): No results found.  ____________________________________________   PROCEDURES  Procedure(s) performed (including Critical Care):  Procedures  ____________________________________________   INITIAL IMPRESSION / ASSESSMENT AND PLAN     75 year old female presenting to the emergency department for treatment and evaluation of left hip pain.  See HPI for further details.  Protocol labs reviewed and are overall reassuring.  Assessment is reassuring as well.  Symptoms most likely secondary to sciatica.  She will be treated with tapered dose of prednisone and encouraged to follow-up with primary care or orthopedics if not improving over the week.  She was encouraged to return to the emergency department for symptoms of change or worsen if she is unable to schedule appointment.     ___________________________________________   FINAL CLINICAL IMPRESSION(S) / ED DIAGNOSES  Final diagnoses:  Sciatica of left side     ED Discharge Orders         Ordered    predniSONE (STERAPRED UNI-PAK 21 TAB) 10 MG (21) TBPK tablet        02/14/21 1358    traMADol (ULTRAM) 50 MG tablet  Every 6 hours PRN        02/14/21 [place] was evaluated in Emergency Department on 02/14/2021 for the symptoms described in the history of present illness. She was evaluated in the context of the global COVID-19 pandemic, which necessitated consideration that the patient might be at risk for infection with the SARS-CoV-2 virus that causes COVID-19. Institutional protocols and algorithms that pertain to the evaluation of patients at risk for COVID-19 are in a state of rapid change based on information released by regulatory bodies including the CDC and federal and state organizations. These policies and algorithms were followed during the patient's care in the ED.   Note:  This document was prepared using Dragon voice recognition software and may include unintentional dictation errors.   Victorino Dike, FNP 02/14/21 1916    Victorino Dike, FNP 02/14/21 1916    Lucrezia Starch, MD 02/15/21 1355

## 2021-02-14 NOTE — ED Triage Notes (Signed)
Pt to ED via POV stating that she is having pain in her left hip x 4 days, pain has progressively gotten worse. Pt states that she has also had urinary frequency that started yesterday, pt is concern that something is going on with her kidney because she had an issue with her kidneys when she was younger. Pt is in NAD.

## 2021-02-17 ENCOUNTER — Encounter: Payer: Self-pay | Admitting: Nurse Practitioner

## 2021-02-17 ENCOUNTER — Other Ambulatory Visit: Payer: Self-pay

## 2021-02-17 ENCOUNTER — Ambulatory Visit (INDEPENDENT_AMBULATORY_CARE_PROVIDER_SITE_OTHER): Payer: Medicare Other | Admitting: Nurse Practitioner

## 2021-02-17 VITALS — BP 148/82 | HR 67 | Temp 98.1°F | Resp 16 | Ht 65.0 in | Wt 170.6 lb

## 2021-02-17 DIAGNOSIS — M5442 Lumbago with sciatica, left side: Secondary | ICD-10-CM

## 2021-02-17 DIAGNOSIS — M25552 Pain in left hip: Secondary | ICD-10-CM

## 2021-02-17 DIAGNOSIS — I10 Essential (primary) hypertension: Secondary | ICD-10-CM

## 2021-02-17 MED ORDER — AMLODIPINE BESYLATE 5 MG PO TABS: ORAL_TABLET | ORAL | 1 refills | Status: DC

## 2021-02-17 MED ORDER — BISOPROLOL FUMARATE 5 MG PO TABS: 5.0000 mg | ORAL_TABLET | Freq: Every day | ORAL | 1 refills | Status: DC

## 2021-02-17 MED ORDER — TRAMADOL HCL 50 MG PO TABS: 50.0000 mg | ORAL_TABLET | Freq: Two times a day (BID) | ORAL | 0 refills | Status: DC | PRN

## 2021-02-17 NOTE — Progress Notes (Signed)
[facility] [place], [place] [address]  Internal MEDICINE  Office Visit Note  Patient Name: Lisa Thompson  [id number]  [id number]  Date of Service: 02/21/2021     Chief Complaint  Patient presents with   Hospitalization Follow-up    Pain from hip to knee     HPI  Earnest presents for an office visit for recent ER visit.  She went to the ER on June 5 due to left lateral hip pain that radiated down her leg to the knee.  The pain started on June 1 but she went to the ER on June 5 because the pain to be worse that morning.  She is also concerned about her kidneys because she reports having a "bad kidney" as a child.  No radiologic imaging was done on her left hip or her spine in the ER.  She was given a prescription for a small amount of tramadol for the pain and instructed to make a follow-up appointment with Dr. Earnestine Leys at [facility].  She is requesting a refill of the tramadol. She is also in need of refills on bisoprolol and amlodipine.  Current Medication: Outpatient Encounter Medications as of 02/17/2021  Medication Sig   Multiple Vitamins-Minerals (CENTRUM SILVER ADULT 50+ PO) Take by mouth daily.   neomycin-colistin-hydrocortisone-thonzonium (CORTISPORIN-TC) 3.11-12-08-0.5 MG/ML OTIC suspension Instill 5 drops in both ears twice a day for 5 days   predniSONE (STERAPRED UNI-PAK 21 TAB) 10 MG (21) TBPK tablet Take 6 tablets on the first day and decrease by 1 tablet each day until finished.   [DISCONTINUED] amLODipine (NORVASC) 5 MG tablet TAKE 1 TABLET BY MOUTH AT BEDTIME FOR BLOOD PRESSURE   [DISCONTINUED] bisoprolol (ZEBETA) 5 MG tablet Take 1 tablet (5 mg total) by mouth daily.   [DISCONTINUED] traMADol (ULTRAM) 50 MG tablet Take 1 tablet (50 mg total) by mouth every 6 (six) hours as needed.   amLODipine (NORVASC) 5 MG tablet TAKE 1 TABLET BY MOUTH AT BEDTIME FOR BLOOD PRESSURE   bisoprolol (ZEBETA) 5 MG tablet Take 1 tablet (5 mg total) by mouth  daily.   traMADol (ULTRAM) 50 MG tablet Take 1 tablet (50 mg total) by mouth every 12 (twelve) hours as needed for moderate pain or severe pain.   No facility-administered encounter medications on file as of 02/17/2021.    Surgical History: Past Surgical History:  Procedure Laterality Date   ABDOMINAL HYSTERECTOMY     CATARACT EXTRACTION W/PHACO Left 12/06/2016   Procedure: CATARACT EXTRACTION PHACO AND INTRAOCULAR LENS PLACEMENT ([facility]);  Surgeon: Eulogio Bear, MD;  Location: [place];  Service: Ophthalmology;  Laterality: Left;  IVA Topical LEFT LATEX allergy   CATARACT EXTRACTION W/PHACO Right 01/03/2017   Procedure: CATARACT EXTRACTION PHACO AND INTRAOCULAR LENS PLACEMENT ([facility]) RIGHT;  Surgeon: Eulogio Bear, MD;  Location: [place];  Service: Ophthalmology;  Laterality: Right;  IVA TOPICAL latex sensitivity   CYST EXCISION     multiple. in different areas   [place], 2007   TONSILLECTOMY      Medical History: Past Medical History:  Diagnosis Date   Arthritis    lower back   Hypertension    Motion sickness     Family History: Family History  Problem Relation Age of Onset   Stroke Son    Breast cancer Neg Hx     Social History   Socioeconomic History   Marital status: Single    Spouse name: Not on file  Number of children: Not on file   Years of education: Not on file   Highest education level: Not on file  Occupational History   Not on file  Tobacco Use   Smoking status: Never   Smokeless tobacco: Never  Vaping Use   Vaping Use: Never used  Substance and Sexual Activity   Alcohol use: Not Currently   Drug use: Never   Sexual activity: Not Currently  Other Topics Concern   Not on file  Social History Narrative   Not on file   Social Determinants of Health   Financial Resource Strain: Not on file  Food Insecurity: Not on file  Transportation Needs: Not on file  Physical Activity: Not on file  Stress: Not on  file  Social Connections: Not on file  Intimate Partner Violence: Not on file      Review of Systems  Constitutional:  Negative for chills, fatigue and unexpected weight change.  HENT:  Negative for congestion, rhinorrhea, sneezing and sore throat.   Eyes:  Negative for redness.  Respiratory:  Negative for cough, chest tightness and shortness of breath.   Cardiovascular:  Negative for chest pain and palpitations.  Gastrointestinal:  Negative for abdominal pain, constipation, diarrhea, nausea and vomiting.  Genitourinary:  Negative for dysuria and frequency.  Musculoskeletal:  Positive for arthralgias and back pain. Negative for joint swelling and neck pain.  Skin:  Negative for rash.  Neurological: Negative.  Negative for tremors and numbness.  Hematological:  Negative for adenopathy. Does not bruise/bleed easily.  Psychiatric/Behavioral:  Negative for behavioral problems (Depression), sleep disturbance and suicidal ideas. The patient is not nervous/anxious.    Vital Signs: BP (!) 148/82   Pulse 67   Temp 98.1 F (36.7 C)   Resp 16   Ht 5\' 5"  (1.651 m)   Wt 170 lb 9.6 oz (77.4 kg)   SpO2 99%   BMI 28.39 kg/m    Physical Exam Vitals reviewed.  Constitutional:      General: She is not in acute distress.    Appearance: She is well-developed. She is not diaphoretic.  HENT:     Head: Normocephalic and atraumatic.  Neck:     Thyroid: No thyromegaly.     Vascular: No JVD.     Trachea: No tracheal deviation.  Cardiovascular:     Rate and Rhythm: Normal rate and regular rhythm.     Pulses: Normal pulses.     Heart sounds: Normal heart sounds. No murmur heard.   No friction rub. No gallop.  Pulmonary:     Effort: Pulmonary effort is normal. No respiratory distress.     Breath sounds: Normal breath sounds. No wheezing or rales.  Chest:     Chest wall: No tenderness.  Skin:    General: Skin is warm and dry.     Capillary Refill: Capillary refill takes less than 2 seconds.   Neurological:     Mental Status: She is alert and oriented to person, place, and time.  Psychiatric:        Mood and Affect: Mood normal.        Behavior: Behavior normal.    Assessment/Plan: 1. Acute left-sided low back pain with left-sided sciatica Patient has been experiencing low back pain with left hip pain and sciatica.  Tramadol refilled.  X-ray of the left hip and lumbar spine ordered. - traMADol (ULTRAM) 50 MG tablet; Take 1 tablet (50 mg total) by mouth every 12 (twelve) hours as needed for moderate  pain or severe pain.  Dispense: 60 tablet; Refill: 0 - DG Arthro Hip Left; Future - DG Lumbar Spine Complete; Future  2. Acute hip pain, left Patient is experiencing left hip pain, tramadol refilled.  Left hip x-ray ordered. - traMADol (ULTRAM) 50 MG tablet; Take 1 tablet (50 mg total) by mouth every 12 (twelve) hours as needed for moderate pain or severe pain.  Dispense: 60 tablet; Refill: 0 - DG Arthro Hip Left; Future  3. Essential hypertension Patient has a history of hypertension.  She is currently taking bisoprolol and amlodipine, refills ordered. - bisoprolol (ZEBETA) 5 MG tablet; Take 1 tablet (5 mg total) by mouth daily.  Dispense: 90 tablet; Refill: 1 - amLODipine (NORVASC) 5 MG tablet; TAKE 1 TABLET BY MOUTH AT BEDTIME FOR BLOOD PRESSURE  Dispense: 90 tablet; Refill: 1   General Counseling: Mikaylee verbalizes understanding of the findings of todays visit and agrees with plan of treatment. I have discussed any further diagnostic evaluation that may be needed or ordered today. We also reviewed her medications today. she has been encouraged to call the office with any questions or concerns that should arise related to todays visit.    Counseling:  [place] Controlled Substance Database was reviewed by me.  Orders Placed This Encounter  Procedures   DG Arthro Hip Left   DG Lumbar Spine Complete    Return in about 3 weeks (around 03/10/2021) for F/U, Stanisha Lorenz PCP, med  refill.   I have reviewed all medical records from hospital follow up including radiology reports and consults from other physicians. Appropriate follow up diagnostics will be scheduled as needed. Patient/ Family understands the plan of treatment. Time spent30 minutes.  This patient was seen by Jonetta Osgood, FNP-C in collaboration with Dr. Clayborn Bigness as a part of collaborative care agreement.   Saanvi Hakala R. Valetta Fuller, MSN, FNP-C Internal Medicine

## 2021-03-03 DIAGNOSIS — M4696 Unspecified inflammatory spondylopathy, lumbar region: Secondary | ICD-10-CM | POA: Diagnosis not present

## 2021-03-03 DIAGNOSIS — M7062 Trochanteric bursitis, left hip: Secondary | ICD-10-CM | POA: Diagnosis not present

## 2021-03-11 ENCOUNTER — Other Ambulatory Visit: Payer: Self-pay

## 2021-03-11 ENCOUNTER — Ambulatory Visit (INDEPENDENT_AMBULATORY_CARE_PROVIDER_SITE_OTHER): Payer: Medicare Other | Admitting: Nurse Practitioner

## 2021-03-11 ENCOUNTER — Encounter: Payer: Self-pay | Admitting: Nurse Practitioner

## 2021-03-11 VITALS — BP 129/66 | HR 56 | Temp 97.8°F | Resp 16 | Ht 65.0 in | Wt 167.0 lb

## 2021-03-11 DIAGNOSIS — Z1231 Encounter for screening mammogram for malignant neoplasm of breast: Secondary | ICD-10-CM | POA: Diagnosis not present

## 2021-03-11 DIAGNOSIS — Z124 Encounter for screening for malignant neoplasm of cervix: Secondary | ICD-10-CM | POA: Diagnosis not present

## 2021-03-11 DIAGNOSIS — Z0001 Encounter for general adult medical examination with abnormal findings: Secondary | ICD-10-CM | POA: Diagnosis not present

## 2021-03-11 DIAGNOSIS — E559 Vitamin D deficiency, unspecified: Secondary | ICD-10-CM

## 2021-03-11 DIAGNOSIS — I10 Essential (primary) hypertension: Secondary | ICD-10-CM | POA: Diagnosis not present

## 2021-03-11 DIAGNOSIS — E782 Mixed hyperlipidemia: Secondary | ICD-10-CM

## 2021-03-11 DIAGNOSIS — Z0189 Encounter for other specified special examinations: Secondary | ICD-10-CM

## 2021-03-11 DIAGNOSIS — R300 Dysuria: Secondary | ICD-10-CM

## 2021-03-11 MED ORDER — TETANUS-DIPHTHERIA TOXOIDS TD 5-2 LFU IM INJ: 0.5000 mL | INJECTION | Freq: Once | INTRAMUSCULAR | 0 refills | Status: AC

## 2021-03-11 MED ORDER — ZOSTER VAC RECOMB ADJUVANTED 50 MCG/0.5ML IM SUSR: 0.5000 mL | Freq: Once | INTRAMUSCULAR | 0 refills | Status: AC

## 2021-03-11 NOTE — Progress Notes (Signed)
[facility] [place], [place] [address]  Internal MEDICINE  Office Visit Note  Patient Name: Lisa Thompson  [id number]  [id number]  Date of Service: 03/14/2021  Chief Complaint  Patient presents with   Medicare Wellness   Hypertension   Arthritis   Quality Metric Gaps    Zoster vaccine ,Tdap     HPI Lisa Thompson presents for an annual well visit and physical exam. she has a history of arthritis and hypertension, hysterectomy, and cataract surgery. She has received both Covid vaccines and 2 booster doses. She recently had an office visit related to right hip pain. After that office visit, she went to [facility] and was seen by Dr. Sabra Heck where she received a steroid injection and prescriptions for meloxicam and methocarbamol.  Her blood pressure is controlled, 129/66, improved from previous visit.  She denies any pain. She is due for a mammogram, tetanus booster, and shingles vaccine. Her heart rate is slightly low today at 56. Will monitor.    Current Medication: Outpatient Encounter Medications as of 03/11/2021  Medication Sig   amLODipine (NORVASC) 5 MG tablet TAKE 1 TABLET BY MOUTH AT BEDTIME FOR BLOOD PRESSURE   bisoprolol (ZEBETA) 5 MG tablet Take 1 tablet (5 mg total) by mouth daily.   Multiple Vitamins-Minerals (CENTRUM SILVER ADULT 50+ PO) Take by mouth daily.   [EXPIRED] tetanus & diphtheria toxoids, adult, (TENIVAC) 5-2 LFU injection Inject 0.5 mLs into the muscle once for 1 dose.   [EXPIRED] Zoster Vaccine Adjuvanted [facility]) injection Inject 0.5 mLs into the muscle once for 1 dose.   [DISCONTINUED] neomycin-colistin-hydrocortisone-thonzonium (CORTISPORIN-TC) 3.11-12-08-0.5 MG/ML OTIC suspension Instill 5 drops in both ears twice a day for 5 days   [DISCONTINUED] predniSONE (STERAPRED UNI-PAK 21 TAB) 10 MG (21) TBPK tablet Take 6 tablets on the first day and decrease by 1 tablet each day until finished.   [DISCONTINUED] traMADol (ULTRAM) 50 MG  tablet Take 1 tablet (50 mg total) by mouth every 12 (twelve) hours as needed for moderate pain or severe pain.   meloxicam (MOBIC) 15 MG tablet Take 15 mg by mouth daily.   methocarbamol (ROBAXIN) 500 MG tablet Take 500 mg by mouth 2 (two) times daily.   No facility-administered encounter medications on file as of 03/11/2021.    Surgical History: Past Surgical History:  Procedure Laterality Date   ABDOMINAL HYSTERECTOMY     CATARACT EXTRACTION W/PHACO Left 12/06/2016   Procedure: CATARACT EXTRACTION PHACO AND INTRAOCULAR LENS PLACEMENT (IOC);  Surgeon: Eulogio Bear, MD;  Location: [place];  Service: Ophthalmology;  Laterality: Left;  IVA Topical LEFT LATEX allergy   CATARACT EXTRACTION W/PHACO Right 01/03/2017   Procedure: CATARACT EXTRACTION PHACO AND INTRAOCULAR LENS PLACEMENT (IOC) RIGHT;  Surgeon: Eulogio Bear, MD;  Location: [place];  Service: Ophthalmology;  Laterality: Right;  IVA TOPICAL latex sensitivity   CYST EXCISION     multiple. in different areas   [place], 2007   TONSILLECTOMY      Medical History: Past Medical History:  Diagnosis Date   Arthritis    lower back   Hypertension    Motion sickness     Family History: Family History  Problem Relation Age of Onset   Stroke Son    Breast cancer Neg Hx     Social History   Socioeconomic History   Marital status: Single    Spouse name: Not on file   Number of children: Not on file   Years  of education: Not on file   Highest education level: Not on file  Occupational History   Not on file  Tobacco Use   Smoking status: Never   Smokeless tobacco: Never  Vaping Use   Vaping Use: Never used  Substance and Sexual Activity   Alcohol use: Not Currently   Drug use: Never   Sexual activity: Not Currently  Other Topics Concern   Not on file  Social History Narrative   Not on file   Social Determinants of Health   Financial Resource Strain: Not on file  Food  Insecurity: Not on file  Transportation Needs: Not on file  Physical Activity: Not on file  Stress: Not on file  Social Connections: Not on file  Intimate Partner Violence: Not on file      Review of Systems  Constitutional:  Negative for activity change, appetite change, chills, fatigue, fever and unexpected weight change.  HENT: Negative.  Negative for congestion, ear pain, rhinorrhea, sore throat and trouble swallowing.   Eyes: Negative.   Respiratory: Negative.  Negative for cough, chest tightness, shortness of breath and wheezing.   Cardiovascular: Negative.  Negative for chest pain.  Gastrointestinal: Negative.  Negative for abdominal pain, blood in stool, constipation, diarrhea, nausea and vomiting.  Endocrine: Negative.   Genitourinary: Negative.  Negative for difficulty urinating, dysuria, frequency, hematuria and urgency.  Musculoskeletal: Negative.  Negative for arthralgias, back pain, joint swelling, myalgias and neck pain.  Skin: Negative.  Negative for rash and wound.  Allergic/Immunologic: Negative.  Negative for immunocompromised state.  Neurological: Negative.  Negative for dizziness, seizures, numbness and headaches.  Hematological: Negative.   Psychiatric/Behavioral: Negative.  Negative for behavioral problems, self-injury and suicidal ideas. The patient is not nervous/anxious.    Vital Signs: BP 129/66   Pulse (!) 56   Temp 97.8 F (36.6 C)   Resp 16   Ht 5' 5"  (1.651 m)   Wt 167 lb (75.8 kg)   SpO2 99%   BMI 27.79 kg/m    Physical Exam Vitals reviewed.  Constitutional:      General: She is not in acute distress.    Appearance: Normal appearance. She is not ill-appearing.  HENT:     Head: Normocephalic and atraumatic.     Right Ear: Tympanic membrane, ear canal and external ear normal.     Left Ear: Tympanic membrane, ear canal and external ear normal.     Nose: Nose normal. No congestion or rhinorrhea.     Mouth/Throat:     Mouth: Mucous  membranes are moist.     Pharynx: Oropharynx is clear. No posterior oropharyngeal erythema.  Eyes:     Extraocular Movements: Extraocular movements intact.     Conjunctiva/sclera: Conjunctivae normal.     Pupils: Pupils are equal, round, and reactive to light.  Cardiovascular:     Rate and Rhythm: Normal rate and regular rhythm.     Pulses: Normal pulses.     Heart sounds: Normal heart sounds. No murmur heard. Pulmonary:     Effort: Pulmonary effort is normal. No respiratory distress.     Breath sounds: Normal breath sounds.  Abdominal:     General: Bowel sounds are normal. There is no distension.     Palpations: Abdomen is soft. There is no mass.     Tenderness: There is no abdominal tenderness. There is no guarding or rebound.     Hernia: No hernia is present.  Musculoskeletal:        General: Normal  range of motion.     Cervical back: Normal range of motion and neck supple.  Lymphadenopathy:     Cervical: No cervical adenopathy.  Skin:    General: Skin is warm and dry.     Capillary Refill: Capillary refill takes less than 2 seconds.  Neurological:     Mental Status: She is alert and oriented to person, place, and time.  Psychiatric:        Mood and Affect: Mood normal.        Thought Content: Thought content normal.        Judgment: Judgment normal.     Assessment/Plan: 1. Encounter for general adult medical examination with abnormal findings Age-appropriate preventive screenings discussed, annual physical exam completed. Mammogram due.   2. Encounter for routine laboratory testing Routine labs ordered.  - CBC with Differential/Platelet - CMP14+EGFR - TSH + free T4  3. Essential hypertension Stable, taking bisoprolol and amlodipine.   4. Mixed hyperlipidemia Recheck lipid panel, not currently on statin therapy.  - Lipid Profile  5. Vitamin D deficiency Rule out low vitamin D - Vitamin D (25 hydroxy)  6. Encounter for screening mammogram for malignant  neoplasm of breast Screening mammogram ordered. - MM Digital Screening; Future  7. Dysuria Routine urinalysis done.  - UA/M w/rflx Culture, Routine      General Counseling: Lisa Thompson verbalizes understanding of the findings of todays visit and agrees with plan of treatment. I have discussed any further diagnostic evaluation that may be needed or ordered today. We also reviewed her medications today. she has been encouraged to call the office with any questions or concerns that should arise related to todays visit.    Orders Placed This Encounter  Procedures   Microscopic Examination   Urine Culture, Reflex   MM Digital Screening   UA/M w/rflx Culture, Routine   CBC with Differential/Platelet   CMP14+EGFR   Lipid Profile   TSH + free T4   Vitamin D (25 hydroxy)    Meds ordered this encounter  Medications   Zoster Vaccine Adjuvanted [facility]) injection    Sig: Inject 0.5 mLs into the muscle once for 1 dose.    Dispense:  0.5 mL    Refill:  0   tetanus & diphtheria toxoids, adult, (TENIVAC) 5-2 LFU injection    Sig: Inject 0.5 mLs into the muscle once for 1 dose.    Dispense:  0.5 mL    Refill:  0    Return in about 6 months (around 09/10/2021) for F/U, BP check, Lisa Thompson PCP.   Total time spent:30 Minutes Time spent includes review of chart, medications, test results, and follow up plan with the patient.   Lisa Thompson Controlled Substance Database was reviewed by me.  This patient was seen by Lisa Osgood, FNP-C in collaboration with Dr. Clayborn Bigness as a part of collaborative care agreement.  Lisa Lindon R. Valetta Fuller, MSN, FNP-C Internal medicine

## 2021-03-12 LAB — CMP14+EGFR
ALT: 17 IU/L (ref 0–32)
AST: 13 IU/L (ref 0–40)
Albumin/Globulin Ratio: 1.5 (ref 1.2–2.2)
Albumin: 4.3 g/dL (ref 3.7–4.7)
Alkaline Phosphatase: 78 IU/L (ref 44–121)
BUN/Creatinine Ratio: 21 (ref 12–28)
BUN: 33 mg/dL — ABNORMAL HIGH (ref 8–27)
Bilirubin Total: 0.5 mg/dL (ref 0.0–1.2)
CO2: 22 mmol/L (ref 20–29)
Calcium: 9.4 mg/dL (ref 8.7–10.3)
Chloride: 104 mmol/L (ref 96–106)
Creatinine, Ser: 1.55 mg/dL — ABNORMAL HIGH (ref 0.57–1.00)
Globulin, Total: 2.8 g/dL (ref 1.5–4.5)
Glucose: 91 mg/dL (ref 65–99)
Potassium: 4.8 mmol/L (ref 3.5–5.2)
Sodium: 143 mmol/L (ref 134–144)
Total Protein: 7.1 g/dL (ref 6.0–8.5)
eGFR: 35 mL/min/{1.73_m2} — ABNORMAL LOW (ref >59)

## 2021-03-12 LAB — LIPID PANEL
Chol/HDL Ratio: 3.3 ratio (ref 0.0–4.4)
Cholesterol, Total: 234 mg/dL — ABNORMAL HIGH (ref 100–199)
HDL: 70 mg/dL (ref >39)
LDL Chol Calc (NIH): 142 mg/dL — ABNORMAL HIGH (ref 0–99)
Triglycerides: 125 mg/dL (ref 0–149)
VLDL Cholesterol Cal: 22 mg/dL (ref 5–40)

## 2021-03-12 LAB — CBC WITH DIFFERENTIAL/PLATELET
Basophils Absolute: 0 10*3/uL (ref 0.0–0.2)
Basos: 0 %
EOS (ABSOLUTE): 0.2 10*3/uL (ref 0.0–0.4)
Eos: 2 %
Hematocrit: 42.2 % (ref 34.0–46.6)
Hemoglobin: 13.8 g/dL (ref 11.1–15.9)
Immature Grans (Abs): 0 10*3/uL (ref 0.0–0.1)
Immature Granulocytes: 0 %
Lymphocytes Absolute: 2.2 10*3/uL (ref 0.7–3.1)
Lymphs: 23 %
MCH: 30.9 pg (ref 26.6–33.0)
MCHC: 32.7 g/dL (ref 31.5–35.7)
MCV: 94 fL (ref 79–97)
Monocytes Absolute: 0.8 10*3/uL (ref 0.1–0.9)
Monocytes: 9 %
Neutrophils Absolute: 6.2 10*3/uL (ref 1.4–7.0)
Neutrophils: 66 %
Platelets: 302 10*3/uL (ref 150–450)
RBC: 4.47 x10E6/uL (ref 3.77–5.28)
RDW: 13.6 % (ref 11.7–15.4)
WBC: 9.5 10*3/uL (ref 3.4–10.8)

## 2021-03-12 LAB — TSH+FREE T4
Free T4: 1.26 ng/dL (ref 0.82–1.77)
TSH: 1.91 u[IU]/mL (ref 0.450–4.500)

## 2021-03-12 LAB — VITAMIN D 25 HYDROXY (VIT D DEFICIENCY, FRACTURES): Vit D, 25-Hydroxy: 24.3 ng/mL — ABNORMAL LOW (ref 30.0–100.0)

## 2021-03-14 LAB — URINE CULTURE, REFLEX

## 2021-03-14 LAB — UA/M W/RFLX CULTURE, ROUTINE
Bilirubin, UA: NEGATIVE
Glucose, UA: NEGATIVE
Leukocytes,UA: NEGATIVE
Nitrite, UA: NEGATIVE
RBC, UA: NEGATIVE
Specific Gravity, UA: 1.028 (ref 1.005–1.030)
Urobilinogen, Ur: 1 mg/dL (ref 0.2–1.0)
pH, UA: 6 (ref 5.0–7.5)

## 2021-03-14 LAB — MICROSCOPIC EXAMINATION
Casts: NONE SEEN /lpf
Epithelial Cells (non renal): >10 /hpf — AB (ref 0–10)
RBC, Urine: NONE SEEN /hpf (ref 0–2)

## 2021-03-18 ENCOUNTER — Ambulatory Visit (INDEPENDENT_AMBULATORY_CARE_PROVIDER_SITE_OTHER): Payer: Medicare Other

## 2021-03-18 ENCOUNTER — Other Ambulatory Visit: Payer: Self-pay

## 2021-03-18 VITALS — BP 140/70 | HR 60 | Resp 16 | Ht 65.0 in | Wt 165.6 lb

## 2021-03-18 DIAGNOSIS — H6123 Impacted cerumen, bilateral: Secondary | ICD-10-CM

## 2021-03-18 NOTE — Progress Notes (Signed)
Pt here today for irrigation done in both ear and lauren check after done and also pt was doing fine no dizziness

## 2021-03-22 ENCOUNTER — Other Ambulatory Visit: Payer: Self-pay | Admitting: Nurse Practitioner

## 2021-03-22 DIAGNOSIS — Z1231 Encounter for screening mammogram for malignant neoplasm of breast: Secondary | ICD-10-CM

## 2021-04-15 ENCOUNTER — Ambulatory Visit
Admission: RE | Admit: 2021-04-15 | Discharge: 2021-04-15 | Disposition: A | Discharge status: Home / Self Care | Payer: Medicare Other | Source: Ambulatory Visit | Attending: Nurse Practitioner | Admitting: Nurse Practitioner

## 2021-04-15 ENCOUNTER — Other Ambulatory Visit: Payer: Self-pay

## 2021-04-15 DIAGNOSIS — Z1231 Encounter for screening mammogram for malignant neoplasm of breast: Secondary | ICD-10-CM | POA: Diagnosis not present

## 2021-09-09 ENCOUNTER — Other Ambulatory Visit: Payer: Self-pay

## 2021-09-09 ENCOUNTER — Ambulatory Visit (INDEPENDENT_AMBULATORY_CARE_PROVIDER_SITE_OTHER): Payer: Medicare Other | Admitting: Nurse Practitioner

## 2021-09-09 ENCOUNTER — Encounter: Payer: Self-pay | Admitting: Nurse Practitioner

## 2021-09-09 VITALS — BP 100/59 | HR 78 | Resp 16 | Ht 65.0 in | Wt 169.8 lb

## 2021-09-09 DIAGNOSIS — I10 Essential (primary) hypertension: Secondary | ICD-10-CM

## 2021-09-09 DIAGNOSIS — E782 Mixed hyperlipidemia: Secondary | ICD-10-CM

## 2021-09-09 DIAGNOSIS — E559 Vitamin D deficiency, unspecified: Secondary | ICD-10-CM

## 2021-09-09 NOTE — Progress Notes (Signed)
[facility] [place], [place] [address]  Internal MEDICINE  Office Visit Note  Patient Name: Lisa Thompson  [id number]  [id number]  Date of Service: 09/09/2021  Chief Complaint  Patient presents with   Follow-up   Hypertension   Weight Loss    Patient is concerned about her weight in general   Eye Problem    Sometimes feels like eyes are glued shut when waking up in the morning    HPI Caprina presents for a follow up visit for hypertension. She has gained 4 lbs since her previous office visit over the holidays. Has a slight low vitamin D level. Her blood pressure is wnl. She denies any new or worsening pains. She does not need any medication refills at this time.    Current Medication: Outpatient Encounter Medications as of 09/09/2021  Medication Sig   amLODipine (NORVASC) 5 MG tablet TAKE 1 TABLET BY MOUTH AT BEDTIME FOR BLOOD PRESSURE   bisoprolol (ZEBETA) 5 MG tablet Take 1 tablet (5 mg total) by mouth daily.   meloxicam (MOBIC) 15 MG tablet Take 15 mg by mouth daily.   methocarbamol (ROBAXIN) 500 MG tablet Take 500 mg by mouth 2 (two) times daily.   Multiple Vitamins-Minerals (CENTRUM SILVER ADULT 50+ PO) Take by mouth daily.   No facility-administered encounter medications on file as of 09/09/2021.    Surgical History: Past Surgical History:  Procedure Laterality Date   ABDOMINAL HYSTERECTOMY     CATARACT EXTRACTION W/PHACO Left 12/06/2016   Procedure: CATARACT EXTRACTION PHACO AND INTRAOCULAR LENS PLACEMENT (IOC);  Surgeon: Eulogio Bear, MD;  Location: [place];  Service: Ophthalmology;  Laterality: Left;  IVA Topical LEFT LATEX allergy   CATARACT EXTRACTION W/PHACO Right 01/03/2017   Procedure: CATARACT EXTRACTION PHACO AND INTRAOCULAR LENS PLACEMENT (IOC) RIGHT;  Surgeon: Eulogio Bear, MD;  Location: [place];  Service: Ophthalmology;  Laterality: Right;  IVA TOPICAL latex sensitivity   CYST EXCISION      multiple. in different areas   [place], 2007   TONSILLECTOMY      Medical History: Past Medical History:  Diagnosis Date   Arthritis    lower back   Hypertension    Motion sickness     Family History: Family History  Problem Relation Age of Onset   Stroke Son    Breast cancer Neg Hx     Social History   Socioeconomic History   Marital status: Single    Spouse name: Not on file   Number of children: Not on file   Years of education: Not on file   Highest education level: Not on file  Occupational History   Not on file  Tobacco Use   Smoking status: Never   Smokeless tobacco: Never  Vaping Use   Vaping Use: Never used  Substance and Sexual Activity   Alcohol use: Not Currently   Drug use: Never   Sexual activity: Not Currently  Other Topics Concern   Not on file  Social History Narrative   Not on file   Social Determinants of Health   Financial Resource Strain: Not on file  Food Insecurity: Not on file  Transportation Needs: Not on file  Physical Activity: Not on file  Stress: Not on file  Social Connections: Not on file  Intimate Partner Violence: Not on file      Review of Systems  Constitutional:  Negative for chills, fatigue and unexpected weight change.  HENT:  Negative for congestion, rhinorrhea, sneezing and sore throat.   Eyes:  Negative for redness.  Respiratory:  Negative for cough, chest tightness and shortness of breath.   Cardiovascular:  Negative for chest pain and palpitations.  Gastrointestinal:  Negative for abdominal pain, constipation, diarrhea, nausea and vomiting.  Genitourinary:  Negative for dysuria and frequency.  Musculoskeletal:  Negative for arthralgias, back pain, joint swelling and neck pain.  Skin:  Negative for rash.  Neurological: Negative.  Negative for tremors and numbness.  Hematological:  Negative for adenopathy. Does not bruise/bleed easily.  Psychiatric/Behavioral:  Negative for behavioral problems  (Depression), sleep disturbance and suicidal ideas. The patient is not nervous/anxious.    Vital Signs: BP (!) 100/59    Pulse 78    Resp 16    Ht 5\' 5"  (1.651 m)    Wt 169 lb 12.8 oz (77 kg)    SpO2 99%    BMI 28.26 kg/m    Physical Exam Vitals reviewed.  Constitutional:      General: She is not in acute distress.    Appearance: Normal appearance. She is not ill-appearing.  HENT:     Head: Normocephalic and atraumatic.  Eyes:     Pupils: Pupils are equal, round, and reactive to light.  Cardiovascular:     Rate and Rhythm: Normal rate and regular rhythm.  Pulmonary:     Effort: Pulmonary effort is normal. No respiratory distress.  Neurological:     Mental Status: She is alert and oriented to person, place, and time.     Cranial Nerves: No cranial nerve deficit.     Coordination: Coordination normal.     Gait: Gait normal.  Psychiatric:        Mood and Affect: Mood normal.        Behavior: Behavior normal.       Assessment/Plan: 1. Essential hypertension Blood pressure is well controlled  2. Mixed hyperlipidemia Lipid panel is normal and patient is not on any statins  3. Vitamin D deficiency Slightly low level, encouraged OTC supplement 2000 units daily   General Counseling: Leliana verbalizes understanding of the findings of todays visit and agrees with plan of treatment. I have discussed any further diagnostic evaluation that may be needed or ordered today. We also reviewed her medications today. she has been encouraged to call the office with any questions or concerns that should arise related to todays visit.    No orders of the defined types were placed in this encounter.   No orders of the defined types were placed in this encounter.   Return in about 6 months (around 03/10/2022) for CPE, [place] PCP.   Total time spent:30 Minutes Time spent includes review of chart, medications, test results, and follow up plan with the patient.   [place] Controlled  Substance Database was reviewed by me.  This patient was seen by Jonetta Osgood, FNP-C in collaboration with Dr. Clayborn Bigness as a part of collaborative care agreement.   Ethanjames Fontenot R. Valetta Fuller, MSN, FNP-C Internal medicine

## 2021-09-10 ENCOUNTER — Encounter: Payer: Self-pay | Admitting: Nurse Practitioner

## 2021-10-04 ENCOUNTER — Telehealth: Payer: Self-pay

## 2021-10-04 NOTE — Telephone Encounter (Signed)
Try to call pt voicemail full Nurse practitioner from [place] [phone] that pt had cardiac heart murmur ,no chest pain as per alyssa Pt need appt and I also let Np know that we will call pt for appt

## 2021-10-04 NOTE — Telephone Encounter (Signed)
Try to call voicemail is full  Lisa Thompson from [facility] [phone] that we made pt appt on wednesday

## 2021-10-06 ENCOUNTER — Ambulatory Visit (INDEPENDENT_AMBULATORY_CARE_PROVIDER_SITE_OTHER): Payer: Medicare Other | Admitting: Nurse Practitioner

## 2021-10-06 ENCOUNTER — Encounter: Payer: Self-pay | Admitting: Nurse Practitioner

## 2021-10-06 ENCOUNTER — Other Ambulatory Visit: Payer: Self-pay

## 2021-10-06 VITALS — BP 140/90 | HR 80 | Temp 97.1°F | Resp 16 | Ht 65.0 in | Wt 168.6 lb

## 2021-10-06 DIAGNOSIS — R9431 Abnormal electrocardiogram [ECG] [EKG]: Secondary | ICD-10-CM | POA: Diagnosis not present

## 2021-10-06 DIAGNOSIS — I10 Essential (primary) hypertension: Secondary | ICD-10-CM | POA: Diagnosis not present

## 2021-10-06 DIAGNOSIS — R011 Cardiac murmur, unspecified: Secondary | ICD-10-CM | POA: Diagnosis not present

## 2021-10-06 MED ORDER — BISOPROLOL FUMARATE 5 MG PO TABS: 5.0000 mg | ORAL_TABLET | Freq: Every day | ORAL | 1 refills | Status: DC

## 2021-10-06 MED ORDER — AMLODIPINE BESYLATE 5 MG PO TABS: ORAL_TABLET | ORAL | 1 refills | Status: DC

## 2021-10-06 NOTE — Progress Notes (Signed)
[facility] [place], [place] [address]  Internal MEDICINE  Office Visit Note  Patient Name: Lisa Thompson  [id number]  [id number]  Date of Service: 10/06/2021  Chief Complaint  Patient presents with   Acute Visit    Cardiac heart murmur     HPI Lisa Thompson presents for an acute sick visit for a heart murmur. She was visited by a nurse practitioner for a home visit from her insurance and was told she has a murmur. EKG was done in office and was sinus rhythm with nonspecific T wave abnormality,  She denies any chest pain, arm pain, jaw pain, neck pain, SOB, chest tightness or lower extremity edema.     Current Medication:  Outpatient Encounter Medications as of 10/06/2021  Medication Sig   meloxicam (MOBIC) 15 MG tablet Take 15 mg by mouth daily.   methocarbamol (ROBAXIN) 500 MG tablet Take 500 mg by mouth 2 (two) times daily.   Multiple Vitamins-Minerals (CENTRUM SILVER ADULT 50+ PO) Take by mouth daily.   [DISCONTINUED] amLODipine (NORVASC) 5 MG tablet TAKE 1 TABLET BY MOUTH AT BEDTIME FOR BLOOD PRESSURE   [DISCONTINUED] bisoprolol (ZEBETA) 5 MG tablet Take 1 tablet (5 mg total) by mouth daily.   amLODipine (NORVASC) 5 MG tablet TAKE 1 TABLET BY MOUTH AT BEDTIME FOR BLOOD PRESSURE   bisoprolol (ZEBETA) 5 MG tablet Take 1 tablet (5 mg total) by mouth daily.   No facility-administered encounter medications on file as of 10/06/2021.      Medical History: Past Medical History:  Diagnosis Date   Arthritis    lower back   Hypertension    Motion sickness      Vital Signs: BP 140/90    Pulse 80    Temp (!) 97.1 F (36.2 C)    Resp 16    Ht 5\' 5"  (1.651 m)    Wt 168 lb 9.6 oz (76.5 kg)    SpO2 99%    BMI 28.06 kg/m    Review of Systems  Constitutional:  Negative for chills, fatigue and unexpected weight change.  HENT:  Negative for congestion, rhinorrhea, sneezing and sore throat.   Eyes:  Negative for redness.  Respiratory:  Negative for  cough, chest tightness and shortness of breath.   Cardiovascular:  Negative for chest pain and palpitations.  Gastrointestinal:  Negative for abdominal pain, constipation, diarrhea, nausea and vomiting.  Genitourinary:  Negative for dysuria and frequency.  Musculoskeletal:  Negative for arthralgias, back pain, joint swelling and neck pain.  Skin:  Negative for rash.  Neurological: Negative.  Negative for tremors and numbness.  Hematological:  Negative for adenopathy. Does not bruise/bleed easily.  Psychiatric/Behavioral:  Negative for behavioral problems (Depression), sleep disturbance and suicidal ideas. The patient is not nervous/anxious.    Physical Exam Vitals reviewed.  Constitutional:      General: She is not in acute distress.    Appearance: Normal appearance. She is not ill-appearing.  HENT:     Head: Normocephalic and atraumatic.  Eyes:     Pupils: Pupils are equal, round, and reactive to light.  Cardiovascular:     Rate and Rhythm: Normal rate and regular rhythm.  Pulmonary:     Effort: Pulmonary effort is normal. No respiratory distress.  Neurological:     Mental Status: She is alert and oriented to person, place, and time.     Cranial Nerves: No cranial nerve deficit.     Coordination: Coordination normal.     Gait: Gait  normal.  Psychiatric:        Mood and Affect: Mood normal.        Behavior: Behavior normal.      Assessment/Plan: 1. Cardiac murmur Murmur noted on auscultation, EKG done, nonspecific Twave abnormality identified. Echo ordered - EKG 12-Lead - ECHOCARDIOGRAM COMPLETE; Future  2. Electrocardiogram showing T wave abnormalities Echo ordered - ECHOCARDIOGRAM COMPLETE; Future  3. Essential hypertension Blood pressure is slightly elevated, patient did not know she was supposed to take bisoprolol so will start taking it tomorrow.  - amLODipine (NORVASC) 5 MG tablet; TAKE 1 TABLET BY MOUTH AT BEDTIME FOR BLOOD PRESSURE  Dispense: 90 tablet; Refill:  1 - bisoprolol (ZEBETA) 5 MG tablet; Take 1 tablet (5 mg total) by mouth daily.  Dispense: 90 tablet; Refill: 1   General Counseling: Lisa Thompson verbalizes understanding of the findings of todays visit and agrees with plan of treatment. I have discussed any further diagnostic evaluation that may be needed or ordered today. We also reviewed her medications today. she has been encouraged to call the office with any questions or concerns that should arise related to todays visit.    Counseling:    Orders Placed This Encounter  Procedures   EKG 12-Lead   ECHOCARDIOGRAM COMPLETE    Meds ordered this encounter  Medications   amLODipine (NORVASC) 5 MG tablet    Sig: TAKE 1 TABLET BY MOUTH AT BEDTIME FOR BLOOD PRESSURE    Dispense:  90 tablet    Refill:  1   bisoprolol (ZEBETA) 5 MG tablet    Sig: Take 1 tablet (5 mg total) by mouth daily.    Dispense:  90 tablet    Refill:  1    Return in about 1 month (around 11/06/2021) for F/U, Echo @ [facility] PCP.  [place] Controlled Substance Database was reviewed by me for overdose risk score (ORS)  Time spent:30 Minutes Time spent with patient included reviewing progress notes, labs, imaging studies, and discussing plan for follow up.   This patient was seen by Jonetta Osgood, FNP-C in collaboration with Dr. Clayborn Bigness as a part of collaborative care agreement.  [name] R. Valetta Fuller, MSN, FNP-C Internal Medicine

## 2021-10-20 ENCOUNTER — Ambulatory Visit: Payer: Medicare Other

## 2021-10-20 ENCOUNTER — Other Ambulatory Visit: Payer: Self-pay

## 2021-10-20 DIAGNOSIS — R011 Cardiac murmur, unspecified: Secondary | ICD-10-CM

## 2021-10-20 DIAGNOSIS — R9431 Abnormal electrocardiogram [ECG] [EKG]: Secondary | ICD-10-CM

## 2021-11-04 ENCOUNTER — Ambulatory Visit (INDEPENDENT_AMBULATORY_CARE_PROVIDER_SITE_OTHER): Payer: Medicare Other | Admitting: Nurse Practitioner

## 2021-11-04 ENCOUNTER — Other Ambulatory Visit: Payer: Self-pay

## 2021-11-04 ENCOUNTER — Encounter: Payer: Self-pay | Admitting: Nurse Practitioner

## 2021-11-04 VITALS — BP 140/80 | HR 67 | Temp 98.2°F | Resp 16 | Ht 65.0 in | Wt 169.6 lb

## 2021-11-04 DIAGNOSIS — R7303 Prediabetes: Secondary | ICD-10-CM | POA: Diagnosis not present

## 2021-11-04 DIAGNOSIS — I10 Essential (primary) hypertension: Secondary | ICD-10-CM | POA: Diagnosis not present

## 2021-11-04 DIAGNOSIS — I351 Nonrheumatic aortic (valve) insufficiency: Secondary | ICD-10-CM | POA: Diagnosis not present

## 2021-11-04 NOTE — Progress Notes (Signed)
[facility] [place], [place] [address]  Internal MEDICINE  Office Visit Note  Patient Name: Lisa Thompson  [id number]  [id number]  Date of Service: 11/04/2021  Chief Complaint  Patient presents with   Follow-up    Discuss prediabetes   Results   Hypertension   Prediabetes    HPI Teigen presents for follow-up visit to discuss prediabetes, hypertension and review her echocardiogram results.  The patient had an elevated A1c of 5.9 which is prediabetic in 2021.  She had a visit from her insurance healthcare team and they did her A1c which was 5.8 last month in January.  She is concerned about the prediabetes and is requesting information about diet modifications.  Her blood pressure is stable on current medications.  She recently had an echocardiogram done regarding a cardiac murmur and abnormal EKG in January.  Discussed echocardiogram results with patient today. The patient's echocardiogram results showed borderline wall thickness, impaired relaxation pattern during diastolic filling, ejection fraction of 50.24%, mild aortic regurgitation, trace tricuspid regurgitation.  She has normal systolic global function of the left ventricle and the aortic valve, tricuspid valve and pulmonic valve are structurally normal.      Current Medication: Outpatient Encounter Medications as of 11/04/2021  Medication Sig   amLODipine (NORVASC) 5 MG tablet TAKE 1 TABLET BY MOUTH AT BEDTIME FOR BLOOD PRESSURE   bisoprolol (ZEBETA) 5 MG tablet Take 1 tablet (5 mg total) by mouth daily.   meloxicam (MOBIC) 15 MG tablet Take 15 mg by mouth daily.   methocarbamol (ROBAXIN) 500 MG tablet Take 500 mg by mouth 2 (two) times daily.   Multiple Vitamins-Minerals (CENTRUM SILVER ADULT 50+ PO) Take by mouth daily.   No facility-administered encounter medications on file as of 11/04/2021.    Surgical History: Past Surgical History:  Procedure Laterality Date   ABDOMINAL HYSTERECTOMY      CATARACT EXTRACTION W/PHACO Left 12/06/2016   Procedure: CATARACT EXTRACTION PHACO AND INTRAOCULAR LENS PLACEMENT (IOC);  Surgeon: Eulogio Bear, MD;  Location: [place];  Service: Ophthalmology;  Laterality: Left;  IVA Topical LEFT LATEX allergy   CATARACT EXTRACTION W/PHACO Right 01/03/2017   Procedure: CATARACT EXTRACTION PHACO AND INTRAOCULAR LENS PLACEMENT (IOC) RIGHT;  Surgeon: Eulogio Bear, MD;  Location: [place];  Service: Ophthalmology;  Laterality: Right;  IVA TOPICAL latex sensitivity   CYST EXCISION     multiple. in different areas   [place], 2007   TONSILLECTOMY      Medical History: Past Medical History:  Diagnosis Date   Arthritis    lower back   Hypertension    Motion sickness    Prediabetes     Family History: Family History  Problem Relation Age of Onset   Hypertension Mother    Stroke Son    Breast cancer Neg Hx     Social History   Socioeconomic History   Marital status: Single    Spouse name: Not on file   Number of children: Not on file   Years of education: Not on file   Highest education level: Not on file  Occupational History   Not on file  Tobacco Use   Smoking status: Never   Smokeless tobacco: Never  Vaping Use   Vaping Use: Never used  Substance and Sexual Activity   Alcohol use: Not Currently   Drug use: Never   Sexual activity: Not Currently  Other Topics Concern   Not on file  Social  History Narrative   Not on file   Social Determinants of Health   Financial Resource Strain: Not on file  Food Insecurity: Not on file  Transportation Needs: Not on file  Physical Activity: Not on file  Stress: Not on file  Social Connections: Not on file  Intimate Partner Violence: Not on file      Review of Systems  Constitutional:  Negative for chills, fatigue and unexpected weight change.  HENT:  Negative for congestion, rhinorrhea, sneezing and sore throat.   Eyes:  Negative for  redness.  Respiratory:  Negative for cough, chest tightness and shortness of breath.   Cardiovascular:  Negative for chest pain and palpitations.  Gastrointestinal:  Negative for abdominal pain, constipation, diarrhea, nausea and vomiting.  Genitourinary:  Negative for dysuria and frequency.  Musculoskeletal:  Negative for arthralgias, back pain, joint swelling and neck pain.  Skin:  Negative for rash.  Neurological: Negative.  Negative for tremors and numbness.  Hematological:  Negative for adenopathy. Does not bruise/bleed easily.  Psychiatric/Behavioral:  Negative for behavioral problems (Depression), sleep disturbance and suicidal ideas. The patient is not nervous/anxious.    Vital Signs: BP 140/80    Pulse 67    Temp 98.2 F (36.8 C)    Resp 16    Ht 5\' 5"  (1.651 m)    Wt 169 lb 9.6 oz (76.9 kg)    SpO2 99%    BMI 28.22 kg/m    Physical Exam Vitals reviewed.  Constitutional:      General: She is not in acute distress.    Appearance: Normal appearance. She is not ill-appearing.  HENT:     Head: Normocephalic and atraumatic.  Eyes:     Pupils: Pupils are equal, round, and reactive to light.  Cardiovascular:     Rate and Rhythm: Normal rate and regular rhythm.  Pulmonary:     Effort: Pulmonary effort is normal. No respiratory distress.  Neurological:     Mental Status: She is alert and oriented to person, place, and time.  Psychiatric:        Mood and Affect: Mood normal.        Behavior: Behavior normal.       Assessment/Plan: 1. Nonrheumatic aortic valve insufficiency Identified on recent echocardiogram, recommendation is to repeat echocardiogram in 3 years.  This is the most likely etiology for the murmur that she has.  2. Prediabetes A1c of 5.8 in January.  Provided patient with handouts about prediabetes and diet modifications.  We will follow-up in 3 months to repeat A1c.  3. Essential hypertension Blood pressure is stable with current medications, no  change.   General Counseling: Landree verbalizes understanding of the findings of todays visit and agrees with plan of treatment. I have discussed any further diagnostic evaluation that may be needed or ordered today. We also reviewed her medications today. she has been encouraged to call the office with any questions or concerns that should arise related to todays visit.    No orders of the defined types were placed in this encounter.   No orders of the defined types were placed in this encounter.   Return in about 3 months (around 02/01/2022) for F/U, Recheck A1C, Valoria Tamburri PCP.   Total time spent:30 Minutes Time spent includes review of chart, medications, test results, and follow up plan with the patient.   [place] Controlled Substance Database was reviewed by me.  This patient was seen by Jonetta Osgood, FNP-C in collaboration with Dr. Clayborn Bigness  as a part of collaborative care agreement.   Josslyn Ciolek R. Valetta Fuller, MSN, FNP-C Internal medicine

## 2021-12-02 DIAGNOSIS — H40003 Preglaucoma, unspecified, bilateral: Secondary | ICD-10-CM | POA: Diagnosis not present

## 2021-12-27 DIAGNOSIS — H1013 Acute atopic conjunctivitis, bilateral: Secondary | ICD-10-CM | POA: Diagnosis not present

## 2022-02-03 ENCOUNTER — Encounter: Payer: Self-pay | Admitting: Nurse Practitioner

## 2022-02-03 ENCOUNTER — Ambulatory Visit (INDEPENDENT_AMBULATORY_CARE_PROVIDER_SITE_OTHER): Payer: Medicare HMO | Admitting: Nurse Practitioner

## 2022-02-03 VITALS — BP 123/60 | HR 60 | Temp 98.1°F | Resp 16 | Ht 65.0 in | Wt 169.6 lb

## 2022-02-03 DIAGNOSIS — I10 Essential (primary) hypertension: Secondary | ICD-10-CM

## 2022-02-03 DIAGNOSIS — R7303 Prediabetes: Secondary | ICD-10-CM | POA: Diagnosis not present

## 2022-02-03 DIAGNOSIS — M25551 Pain in right hip: Secondary | ICD-10-CM | POA: Diagnosis not present

## 2022-02-03 LAB — POCT GLYCOSYLATED HEMOGLOBIN (HGB A1C): Hemoglobin A1C: 5.8 % — AB (ref 4.0–5.6)

## 2022-02-03 NOTE — Progress Notes (Signed)
[facility] [place], [address] [address]  Internal MEDICINE  Office Visit Note  Patient Name: Lisa Thompson  [id number]  [id number]  Date of Service: 02/03/2022  Chief Complaint  Patient presents with   Follow-up    Right hip/leg has pain, pt fell about 4 weeks ago, pain is worse in the morning    Hypertension    HPI Lisa Thompson presents for a follow-up visit for hypertension, right hip and leg pain 4 weeks ago and pain is worse in the morning.  She also has been having issues with allergic rhinitis with itchy watery eyes and is using over-the-counter ketotifen which is helping.  Mental status exam was performed with the patient today and she did well so her cognition is intact. Patient has been feeling more hungry lately and thirsty.  We checked an A1c on her today and it was elevated at 5.8 which is prediabetic. Her blood pressure is stable and well-controlled with current medications.    Current Medication: Outpatient Encounter Medications as of 02/03/2022  Medication Sig   meloxicam (MOBIC) 15 MG tablet Take 15 mg by mouth daily.   methocarbamol (ROBAXIN) 500 MG tablet Take 500 mg by mouth 2 (two) times daily.   Multiple Vitamins-Minerals (CENTRUM SILVER ADULT 50+ PO) Take by mouth daily.   [DISCONTINUED] amLODipine (NORVASC) 5 MG tablet TAKE 1 TABLET BY MOUTH AT BEDTIME FOR BLOOD PRESSURE   [DISCONTINUED] bisoprolol (ZEBETA) 5 MG tablet Take 1 tablet (5 mg total) by mouth daily.   No facility-administered encounter medications on file as of 02/03/2022.    Surgical History: Past Surgical History:  Procedure Laterality Date   ABDOMINAL HYSTERECTOMY     CATARACT EXTRACTION W/PHACO Left 12/06/2016   Procedure: CATARACT EXTRACTION PHACO AND INTRAOCULAR LENS PLACEMENT (IOC);  Surgeon: Eulogio Bear, MD;  Location: [place];  Service: Ophthalmology;  Laterality: Left;  IVA Topical LEFT LATEX allergy   CATARACT EXTRACTION W/PHACO Right  01/03/2017   Procedure: CATARACT EXTRACTION PHACO AND INTRAOCULAR LENS PLACEMENT (IOC) RIGHT;  Surgeon: Eulogio Bear, MD;  Location: [place];  Service: Ophthalmology;  Laterality: Right;  IVA TOPICAL latex sensitivity   CYST EXCISION     multiple. in different areas   [place], 2007   TONSILLECTOMY      Medical History: Past Medical History:  Diagnosis Date   Arthritis    lower back   Hypertension    Motion sickness    Prediabetes     Family History: Family History  Problem Relation Age of Onset   Hypertension Mother    Stroke Son    Breast cancer Neg Hx     Social History   Socioeconomic History   Marital status: Single    Spouse name: Not on file   Number of children: Not on file   Years of education: Not on file   Highest education level: Not on file  Occupational History   Not on file  Tobacco Use   Smoking status: Never   Smokeless tobacco: Never  Vaping Use   Vaping Use: Never used  Substance and Sexual Activity   Alcohol use: Not Currently   Drug use: Never   Sexual activity: Not Currently  Other Topics Concern   Not on file  Social History Narrative   Not on file   Social Determinants of Health   Financial Resource Strain: Not on file  Food Insecurity: Not on file  Transportation Needs: Not on file  Physical Activity: Not on file  Stress: Not on file  Social Connections: Not on file  Intimate Partner Violence: Not on file      Review of Systems  Constitutional:  Negative for chills, fatigue and unexpected weight change.  HENT:  Negative for congestion, rhinorrhea, sneezing and sore throat.   Eyes:  Negative for redness.  Respiratory: Negative.  Negative for cough, chest tightness, shortness of breath and wheezing.   Cardiovascular: Negative.  Negative for chest pain and palpitations.  Gastrointestinal:  Negative for abdominal pain, constipation, diarrhea, nausea and vomiting.  Endocrine: Positive for polydipsia,  polyphagia and polyuria.  Genitourinary:  Negative for dysuria and frequency.  Musculoskeletal:  Positive for arthralgias. Negative for back pain, joint swelling and neck pain.       Patient reports leg pain and hip pain on the right side.  This is a new pain, not sure where it is coming from.  She has not had any injuries or falls that she knows of  Skin:  Negative for rash.  Neurological: Negative.  Negative for tremors and numbness.  Hematological:  Negative for adenopathy. Does not bruise/bleed easily.  Psychiatric/Behavioral:  Negative for behavioral problems (Depression), sleep disturbance and suicidal ideas. The patient is not nervous/anxious.     Vital Signs: BP 123/60   Pulse 60   Temp 98.1 F (36.7 C)   Resp 16   Ht '5\' 5"'$  (1.651 m)   Wt 169 lb 9.6 oz (76.9 kg)   SpO2 98%   BMI 28.22 kg/m    Physical Exam Vitals reviewed.  Constitutional:      General: She is not in acute distress.    Appearance: Normal appearance. She is not ill-appearing.  HENT:     Head: Normocephalic and atraumatic.  Eyes:     Pupils: Pupils are equal, round, and reactive to light.  Cardiovascular:     Rate and Rhythm: Normal rate and regular rhythm.  Pulmonary:     Effort: Pulmonary effort is normal. No respiratory distress.  Neurological:     Mental Status: She is alert and oriented to person, place, and time.  Psychiatric:        Mood and Affect: Mood normal.        Behavior: Behavior normal.        Assessment/Plan: 1. Prediabetes A1c is slightly elevated at 5.8 which is prediabetic.  Patient was provided with information about nutrition, healthy food choices, other information about ways to prevent type 2 diabetes. - POCT HgB A1C  2. Acute right hip pain X-ray ordered for the right hip to rule out any musculoskeletal or joint abnormalities.  It is likely that the pain is a flareup of arthritis.  Patient reports that the pain is manageable with over-the-counter medications right  now.   - DG Hip Unilat W OR W/O Pelvis 2-3 Views Right; Future  3. Essential hypertension Blood pressure is stable and well-controlled on current medications, no changes and no refills are needed at this time.   General Counseling: Lisa Thompson verbalizes understanding of the findings of todays visit and agrees with plan of treatment. I have discussed any further diagnostic evaluation that may be needed or ordered today. We also reviewed her medications today. she has been encouraged to call the office with any questions or concerns that should arise related to todays visit.    Orders Placed This Encounter  Procedures   DG Hip Unilat W OR W/O Pelvis 2-3 Views Right   POCT HgB A1C  No orders of the defined types were placed in this encounter.   Return in about 3 months (around 05/06/2022) for F/U, Recheck A1C, Alain Deschene PCP.   Total time spent: 30 minutes Time spent includes review of chart, medications, test results, and follow up plan with the patient.    Controlled Substance Database was reviewed by me.  This patient was seen by Jonetta Osgood, FNP-C in collaboration with Dr. Clayborn Bigness as a part of collaborative care agreement.   Nevaeh Casillas R. Valetta Fuller, MSN, FNP-C Internal medicine

## 2022-02-04 ENCOUNTER — Ambulatory Visit
Admission: RE | Admit: 2022-02-04 | Discharge: 2022-02-04 | Disposition: A | Discharge status: Home / Self Care | Payer: Medicare HMO | Attending: Nurse Practitioner | Admitting: Nurse Practitioner

## 2022-02-04 ENCOUNTER — Ambulatory Visit
Admission: RE | Admit: 2022-02-04 | Discharge: 2022-02-04 | Disposition: A | Discharge status: Home / Self Care | Payer: Medicare HMO | Source: Ambulatory Visit | Attending: Nurse Practitioner | Admitting: Nurse Practitioner

## 2022-02-04 DIAGNOSIS — M25551 Pain in right hip: Secondary | ICD-10-CM

## 2022-02-07 NOTE — Progress Notes (Signed)
Please call the patient and let her know that the x-ray of her hip was negative there was no fracture or dislocation or other abnormality found.

## 2022-02-08 ENCOUNTER — Telehealth: Payer: Self-pay

## 2022-02-08 NOTE — Telephone Encounter (Signed)
-----   Message from Jonetta Osgood, NP sent at 02/07/2022 12:34 PM EDT ----- Please call the patient and let her know that the x-ray of her hip was negative there was no fracture or dislocation or other abnormality found.

## 2022-02-09 NOTE — Telephone Encounter (Signed)
Spoke to pt provided results

## 2022-02-09 NOTE — Telephone Encounter (Signed)
-----   Message from Jonetta Osgood, NP sent at 02/07/2022 12:34 PM EDT ----- Please call the patient and let her know that the x-ray of her hip was negative there was no fracture or dislocation or other abnormality found.

## 2022-02-15 ENCOUNTER — Other Ambulatory Visit: Payer: Self-pay

## 2022-02-15 DIAGNOSIS — I10 Essential (primary) hypertension: Secondary | ICD-10-CM

## 2022-02-15 MED ORDER — BISOPROLOL FUMARATE 5 MG PO TABS: 5.0000 mg | ORAL_TABLET | Freq: Every day | ORAL | 1 refills | Status: DC

## 2022-02-15 MED ORDER — AMLODIPINE BESYLATE 5 MG PO TABS: ORAL_TABLET | ORAL | 1 refills | Status: DC

## 2022-03-08 ENCOUNTER — Telehealth: Payer: Self-pay

## 2022-03-08 NOTE — Telephone Encounter (Signed)
Left vm to confirm 03/14/22 appointment-Toni

## 2022-03-14 ENCOUNTER — Ambulatory Visit: Payer: Medicare Other | Admitting: Nurse Practitioner

## 2022-03-24 ENCOUNTER — Encounter: Payer: Self-pay | Admitting: Nurse Practitioner

## 2022-04-14 DIAGNOSIS — H40003 Preglaucoma, unspecified, bilateral: Secondary | ICD-10-CM | POA: Diagnosis not present

## 2022-04-16 ENCOUNTER — Encounter: Payer: Self-pay | Admitting: Emergency Medicine

## 2022-04-16 ENCOUNTER — Emergency Department
Admission: EM | Admit: 2022-04-16 | Discharge: 2022-04-16 | Disposition: A | Discharge status: Home / Self Care | Payer: Medicare Other | Attending: Emergency Medicine | Admitting: Emergency Medicine

## 2022-04-16 ENCOUNTER — Emergency Department: Payer: Medicare Other

## 2022-04-16 ENCOUNTER — Other Ambulatory Visit: Payer: Self-pay

## 2022-04-16 DIAGNOSIS — M11261 Other chondrocalcinosis, right knee: Secondary | ICD-10-CM | POA: Diagnosis not present

## 2022-04-16 DIAGNOSIS — S79911A Unspecified injury of right hip, initial encounter: Secondary | ICD-10-CM | POA: Diagnosis not present

## 2022-04-16 DIAGNOSIS — M25561 Pain in right knee: Secondary | ICD-10-CM | POA: Diagnosis not present

## 2022-04-16 DIAGNOSIS — M5137 Other intervertebral disc degeneration, lumbosacral region: Secondary | ICD-10-CM | POA: Diagnosis not present

## 2022-04-16 DIAGNOSIS — S20211A Contusion of right front wall of thorax, initial encounter: Secondary | ICD-10-CM | POA: Diagnosis not present

## 2022-04-16 DIAGNOSIS — M25551 Pain in right hip: Secondary | ICD-10-CM | POA: Diagnosis not present

## 2022-04-16 DIAGNOSIS — G9389 Other specified disorders of brain: Secondary | ICD-10-CM | POA: Insufficient documentation

## 2022-04-16 DIAGNOSIS — R9082 White matter disease, unspecified: Secondary | ICD-10-CM | POA: Insufficient documentation

## 2022-04-16 DIAGNOSIS — S0990XA Unspecified injury of head, initial encounter: Secondary | ICD-10-CM | POA: Diagnosis not present

## 2022-04-16 DIAGNOSIS — I10 Essential (primary) hypertension: Secondary | ICD-10-CM | POA: Insufficient documentation

## 2022-04-16 DIAGNOSIS — W182XXA Fall in (into) shower or empty bathtub, initial encounter: Secondary | ICD-10-CM | POA: Diagnosis not present

## 2022-04-16 DIAGNOSIS — R0781 Pleurodynia: Secondary | ICD-10-CM | POA: Diagnosis not present

## 2022-04-16 DIAGNOSIS — M2578 Osteophyte, vertebrae: Secondary | ICD-10-CM | POA: Diagnosis not present

## 2022-04-16 DIAGNOSIS — S199XXA Unspecified injury of neck, initial encounter: Secondary | ICD-10-CM | POA: Diagnosis not present

## 2022-04-16 DIAGNOSIS — M1611 Unilateral primary osteoarthritis, right hip: Secondary | ICD-10-CM | POA: Diagnosis not present

## 2022-04-16 DIAGNOSIS — W19XXXA Unspecified fall, initial encounter: Secondary | ICD-10-CM

## 2022-04-16 DIAGNOSIS — M7918 Myalgia, other site: Secondary | ICD-10-CM

## 2022-04-16 DIAGNOSIS — Z043 Encounter for examination and observation following other accident: Secondary | ICD-10-CM | POA: Diagnosis not present

## 2022-04-16 MED ORDER — ACETAMINOPHEN 500 MG PO TABS
1000.0000 mg | ORAL_TABLET | Freq: Once | ORAL | Status: AC
  Administered 2022-04-16: 1000 mg via ORAL
  Filled 2022-04-16: qty 2

## 2022-04-16 NOTE — ED Triage Notes (Signed)
Pt reports pain 10/10 but states she does not want a wheelchair

## 2022-04-16 NOTE — ED Triage Notes (Signed)
Pt reports slipped and fell in the shower yesterday and still has pain to her right hip and knee. Pt reports has bruising to right hip as well. Denies LOC or other injuries. Pt reports does not take blood thinners. Pt BP elevated in triage, reports has not taken meds today because she has not eaten.

## 2022-04-16 NOTE — ED Provider Notes (Signed)
[facility] Provider Note    Event Date/Time   First MD Initiated Contact with Patient 04/16/22 1213     (approximate)   History   Fall   HPI  Lisa Thompson is a 75 y.o. female with history of hypertension, degenerative disc disease, and remaining history as listed below presents to the emergency department for treatment and evaluation after a mechanical, nonsyncopal fall at home yesterday.  Patient states that she was getting out of the shower, slipped, and fell.  She is having pain in her right rib area and right hip.  Patient denies striking her head or experiencing any loss of consciousness. She is not on a blood thinner.   Past Medical History:  Diagnosis Date   Arthritis    lower back   Hypertension    Motion sickness    Prediabetes      Physical Exam   Triage Vital Signs: ED Triage Vitals  Enc Vitals Group     BP 04/16/22 1106 (!) 190/92     Pulse Rate 04/16/22 1106 (!) 59     Resp 04/16/22 1106 20     Temp 04/16/22 1106 98.7 F (37.1 C)     Temp Source 04/16/22 1106 Oral     SpO2 04/16/22 1106 98 %     Weight 04/16/22 1105 165 lb (74.8 kg)     Height 04/16/22 1105 '5\' 5"'$  (1.651 m)     Head Circumference --      Peak Flow --      Pain Score 04/16/22 1104 10     Pain Loc --      Pain Edu? --      Excl. in [place]? --     Most recent vital signs: Vitals:   04/16/22 1106 04/16/22 1446  BP: (!) 190/92 (!) 212/69  Pulse: (!) 59 70  Resp: 20 17  Temp: 98.7 F (37.1 C)   SpO2: 98% 98%    General: Awake, no distress.  Awake, alert, oriented x4. CV:  Good peripheral perfusion.  Resp:  Normal effort.  Abd:  No distention.  Other:  Contusion noted to the right lateral chest wall over the lower ribs.  No pain in the right hip with internal or external rotation.  No shortening or rotation of the right lower extremity.  No focal midline tenderness over the cervical spine.   ED Results / Procedures / Treatments   Labs (all labs  ordered are listed, but only abnormal results are displayed) Labs Reviewed - No data to display   EKG  Not indicated   RADIOLOGY  Images of the right hip, right knee, right chest and ribs are all without acute findings.  CT of the cervical spine, head, and right hip were all negative for acute findings.  I have independently reviewed and interpreted imaging as well as reviewed report from radiology.  PROCEDURES:  Critical Care performed: No  Procedures   MEDICATIONS ORDERED IN ED:  Medications  acetaminophen (TYLENOL) tablet 1,000 mg (1,000 mg Oral Given 04/16/22 1244)     IMPRESSION / MDM / ASSESSMENT AND PLAN / ED COURSE   I reviewed the triage vital signs and the nursing notes.  Differential diagnosis includes, but is not limited to: ICH, cervical vertebral injury, rib fracture, hip fracture, musculoskeletal strain.  Patient's presentation is most consistent with acute presentation with potential threat to life or bodily function.  Imaging is reassuring and all negative for acute concerns.  While here, the  patient was given 2 Tylenols with significant relief of pain.  Plan will be to discharge her home and encouraged her to take 2 Tylenol every 6-8 hours if needed for pain.  If her symptoms change or worsen and she is unable to see primary care she is to return to the emergency department.      FINAL CLINICAL IMPRESSION(S) / ED DIAGNOSES   Final diagnoses:  Fall, initial encounter  Musculoskeletal pain     Rx / DC Orders   ED Discharge Orders     None        Note:  This document was prepared using Dragon voice recognition software and may include unintentional dictation errors.   Victorino Dike, FNP 04/16/22 1559    Delman Kitten, MD 04/16/22 1758

## 2022-04-16 NOTE — ED Notes (Signed)
Pt ambulatory with steady gait to treatment room, comes today for complaints of pain to whole body but mostly R side after experiencing mechanical fall (slipping) in shower yesterday. Pt is alert and oriented.

## 2022-04-16 NOTE — Discharge Instructions (Signed)
You may take 2 extra strength Tylenol every 6-8 hours when needed for pain.  Please follow-up with your primary care provider if your symptoms are not improving over the next few days.  Return to the emergency department for symptoms of change or worsen if you are unable to schedule an appointment.

## 2022-05-05 ENCOUNTER — Ambulatory Visit (INDEPENDENT_AMBULATORY_CARE_PROVIDER_SITE_OTHER): Payer: Medicare HMO | Admitting: Nurse Practitioner

## 2022-05-05 VITALS — BP 140/72 | HR 60 | Temp 98.2°F | Resp 16 | Ht 65.0 in | Wt 172.0 lb

## 2022-05-05 DIAGNOSIS — M25551 Pain in right hip: Secondary | ICD-10-CM | POA: Diagnosis not present

## 2022-05-05 DIAGNOSIS — R7303 Prediabetes: Secondary | ICD-10-CM

## 2022-05-05 DIAGNOSIS — Z23 Encounter for immunization: Secondary | ICD-10-CM | POA: Diagnosis not present

## 2022-05-05 DIAGNOSIS — I10 Essential (primary) hypertension: Secondary | ICD-10-CM | POA: Diagnosis not present

## 2022-05-05 LAB — POCT GLYCOSYLATED HEMOGLOBIN (HGB A1C): Hemoglobin A1C: 5.9 % — AB (ref 4.0–5.6)

## 2022-05-05 MED ORDER — ZOSTER VAC RECOMB ADJUVANTED 50 MCG/0.5ML IM SUSR: 0.5000 mL | Freq: Once | INTRAMUSCULAR | 0 refills | Status: AC

## 2022-05-05 MED ORDER — PNEUMOCOCCAL 20-VAL CONJ VACC 0.5 ML IM SUSY: 0.5000 mL | PREFILLED_SYRINGE | INTRAMUSCULAR | 0 refills | Status: AC

## 2022-05-05 NOTE — Progress Notes (Signed)
[facility] [place], [place] [address]  Internal MEDICINE  Office Visit Note  Patient Name: Lisa Thompson  [id number]  [id number]  Date of Service: 05/07/2022  Chief Complaint  Patient presents with   Follow-up   Hypertension   Hip Pain    Still having hip pain - right side   Quality Metric Gaps    Shingles and Pneumonia Vaccine    HPI Cathline presents for a follow-up visit for hypertension, right hip pain, and prediabetes. --At her previous office visit she was provided with additional information on foods that would be good for her glucose levels and to help prevent her from developing diabetes as well as information about portion sizes. -- She has gained about 3 pounds since her previous office visit, her A1c is 5.9 today which is not significantly changed from her A1c in May which was 5.8. She has been enjoying some high sugar foods such as sweets and breads but will start working on decreasing the frequency that she eats high carb and high sugar foods, will provide patient with a list of foods to look out for ---Started walking her 3 story apartment complex for 20-30 min daily.  This change started a few days ago so there will hopefully be significant change at her follow-up visit in 3 months. --Needs updated shingles vaccine and pneumonia vaccine. -- She is having residual right hip pain from a previous fall that did cause a hematoma which has taken significant time to shrink and resolve.  She does report that the hip pain is not as bad but that it is still there.  She also states that it is tolerable and she has not needed any additional medication other than over-the-counter acetaminophen or maybe ibuprofen.   Current Medication: Outpatient Encounter Medications as of 05/05/2022  Medication Sig   amLODipine (NORVASC) 5 MG tablet TAKE 1 TABLET BY MOUTH AT BEDTIME FOR BLOOD PRESSURE   bisoprolol (ZEBETA) 5 MG tablet Take 1 tablet (5 mg total) by  mouth daily.   meloxicam (MOBIC) 15 MG tablet Take 15 mg by mouth daily.   methocarbamol (ROBAXIN) 500 MG tablet Take 500 mg by mouth 2 (two) times daily.   Multiple Vitamins-Minerals (CENTRUM SILVER ADULT 50+ PO) Take by mouth daily.   [EXPIRED] pneumococcal 20-valent conjugate vaccine (PREVNAR 20) 0.5 ML injection Inject 0.5 mLs into the muscle tomorrow at 10 am for 1 dose.   [EXPIRED] Zoster Vaccine Adjuvanted [facility]) injection Inject 0.5 mLs into the muscle once for 1 dose.   No facility-administered encounter medications on file as of 05/05/2022.    Surgical History: Past Surgical History:  Procedure Laterality Date   ABDOMINAL HYSTERECTOMY     CATARACT EXTRACTION W/PHACO Left 12/06/2016   Procedure: CATARACT EXTRACTION PHACO AND INTRAOCULAR LENS PLACEMENT (IOC);  Surgeon: Eulogio Bear, MD;  Location: [place];  Service: Ophthalmology;  Laterality: Left;  IVA Topical LEFT LATEX allergy   CATARACT EXTRACTION W/PHACO Right 01/03/2017   Procedure: CATARACT EXTRACTION PHACO AND INTRAOCULAR LENS PLACEMENT (IOC) RIGHT;  Surgeon: Eulogio Bear, MD;  Location: [place];  Service: Ophthalmology;  Laterality: Right;  IVA TOPICAL latex sensitivity   CYST EXCISION     multiple. in different areas   [place], 2007   TONSILLECTOMY      Medical History: Past Medical History:  Diagnosis Date   Arthritis    lower back   Hypertension    Motion sickness    Prediabetes  Family History: Family History  Problem Relation Age of Onset   Hypertension Mother    Stroke Son    Breast cancer Neg Hx     Social History   Socioeconomic History   Marital status: Single    Spouse name: Not on file   Number of children: Not on file   Years of education: Not on file   Highest education level: Not on file  Occupational History   Not on file  Tobacco Use   Smoking status: Never   Smokeless tobacco: Never  Vaping Use   Vaping Use: Never used   Substance and Sexual Activity   Alcohol use: Not Currently   Drug use: Never   Sexual activity: Not Currently  Other Topics Concern   Not on file  Social History Narrative   Not on file   Social Determinants of Health   Financial Resource Strain: Not on file  Food Insecurity: Not on file  Transportation Needs: Not on file  Physical Activity: Not on file  Stress: Not on file  Social Connections: Not on file  Intimate Partner Violence: Not on file      Review of Systems  Constitutional:  Negative for chills, fatigue and unexpected weight change.  HENT:  Negative for congestion, rhinorrhea, sneezing and sore throat.   Eyes:  Negative for redness.  Respiratory:  Negative for cough, chest tightness and shortness of breath.   Cardiovascular:  Negative for chest pain and palpitations.  Gastrointestinal:  Negative for abdominal pain, constipation, diarrhea, nausea and vomiting.  Genitourinary:  Negative for dysuria and frequency.  Musculoskeletal:  Negative for arthralgias, back pain, joint swelling and neck pain.  Skin:  Negative for rash.  Neurological: Negative.  Negative for tremors and numbness.  Hematological:  Negative for adenopathy. Does not bruise/bleed easily.  Psychiatric/Behavioral:  Negative for behavioral problems (Depression), sleep disturbance and suicidal ideas. The patient is not nervous/anxious.     Vital Signs: BP (!) 140/72 Comment: 142/86  Pulse 60   Temp 98.2 F (36.8 C)   Resp 16   Ht '5\' 5"'$  (1.651 m)   Wt 172 lb (78 kg)   SpO2 99%   BMI 28.62 kg/m    Physical Exam Vitals reviewed.  Constitutional:      General: She is not in acute distress.    Appearance: Normal appearance. She is not ill-appearing.  HENT:     Head: Normocephalic and atraumatic.  Eyes:     Pupils: Pupils are equal, round, and reactive to light.  Cardiovascular:     Rate and Rhythm: Normal rate and regular rhythm.  Pulmonary:     Effort: Pulmonary effort is normal. No  respiratory distress.  Neurological:     Mental Status: She is alert and oriented to person, place, and time.  Psychiatric:        Mood and Affect: Mood normal.        Behavior: Behavior normal.        Assessment/Plan: 1. Prediabetes Stable, no changes, provided a list of food to space out during the week or lessen the frequency that they are consumed which will help her A1C to improve.  - POCT HgB A1C  2. Acute right hip pain Improving, still there but tolerable, will continue to use OTC medication if needed  3. Essential hypertension Stable, continue meds, no changes  4. Need for vaccination - Zoster Vaccine Adjuvanted [facility]) injection; Inject 0.5 mLs into the muscle once for 1 dose.  Dispense: 0.5  mL; Refill: 0 - pneumococcal 20-valent conjugate vaccine (PREVNAR 20) 0.5 ML injection; Inject 0.5 mLs into the muscle tomorrow at 10 am for 1 dose.  Dispense: 0.5 mL; Refill: 0    General Counseling: Keera verbalizes understanding of the findings of todays visit and agrees with plan of treatment. I have discussed any further diagnostic evaluation that may be needed or ordered today. We also reviewed her medications today. she has been encouraged to call the office with any questions or concerns that should arise related to todays visit.    Orders Placed This Encounter  Procedures   POCT HgB A1C    Meds ordered this encounter  Medications   Zoster Vaccine Adjuvanted [facility]) injection    Sig: Inject 0.5 mLs into the muscle once for 1 dose.    Dispense:  0.5 mL    Refill:  0   pneumococcal 20-valent conjugate vaccine (PREVNAR 20) 0.5 ML injection    Sig: Inject 0.5 mLs into the muscle tomorrow at 10 am for 1 dose.    Dispense:  0.5 mL    Refill:  0    Return in about 3 months (around 08/05/2022) for F/U, Weight loss, Recheck A1C, Crystall Donaldson PCP.   Total time spent:30 Minutes Time spent includes review of chart, medications, test results, and follow up plan with  the patient.   [place] Controlled Substance Database was reviewed by me.  This patient was seen by Jonetta Osgood, FNP-C in collaboration with Dr. Clayborn Bigness as a part of collaborative care agreement.   Serenitie [place] R. Valetta Fuller, MSN, FNP-C Internal medicine

## 2022-05-05 NOTE — Patient Instructions (Signed)
Things to do:  Get pneumonia vaccine from pharmacy (Prevnar 20) Get high dose flu shot when available from pharmacy (should be sometime in September) Get Shingles vaccine later in the year after pneumonia and flu vaccines (Shingrix) and is a 2 dose series administered 2 months apart from your pharmacy.   Foods to decrease in diet or moderate intake of: --breads --cereals --grains --rice --grits --potatoes --sugary sweets --juices   ----make sure to stay hydrated and eat good lean proteins (chicken, [place] and fish are best) and vegetables.  ----keep doing your daily activity with exercise as much as tolerated and enjoyed.

## 2022-05-07 ENCOUNTER — Encounter: Payer: Self-pay | Admitting: Nurse Practitioner

## 2022-05-12 ENCOUNTER — Other Ambulatory Visit: Payer: Self-pay

## 2022-05-12 DIAGNOSIS — I10 Essential (primary) hypertension: Secondary | ICD-10-CM

## 2022-05-12 DIAGNOSIS — E782 Mixed hyperlipidemia: Secondary | ICD-10-CM

## 2022-05-12 DIAGNOSIS — Z0189 Encounter for other specified special examinations: Secondary | ICD-10-CM

## 2022-05-12 DIAGNOSIS — Z1231 Encounter for screening mammogram for malignant neoplasm of breast: Secondary | ICD-10-CM

## 2022-05-12 DIAGNOSIS — R300 Dysuria: Secondary | ICD-10-CM

## 2022-05-12 DIAGNOSIS — Z0001 Encounter for general adult medical examination with abnormal findings: Secondary | ICD-10-CM

## 2022-05-12 DIAGNOSIS — E559 Vitamin D deficiency, unspecified: Secondary | ICD-10-CM

## 2022-05-12 MED ORDER — MELOXICAM 15 MG PO TABS: 15.0000 mg | ORAL_TABLET | Freq: Every day | ORAL | 1 refills | Status: DC

## 2022-05-12 MED ORDER — AMLODIPINE BESYLATE 5 MG PO TABS: ORAL_TABLET | ORAL | 1 refills | Status: DC

## 2022-05-12 MED ORDER — METHOCARBAMOL 500 MG PO TABS: 500.0000 mg | ORAL_TABLET | Freq: Two times a day (BID) | ORAL | 1 refills | Status: DC

## 2022-05-12 MED ORDER — BISOPROLOL FUMARATE 5 MG PO TABS: 5.0000 mg | ORAL_TABLET | Freq: Every day | ORAL | 1 refills | Status: DC

## 2022-05-18 ENCOUNTER — Other Ambulatory Visit: Payer: Self-pay

## 2022-05-18 MED ORDER — CENTRUM SILVER ADULT 50+ PO TABS: ORAL_TABLET | ORAL | 1 refills | Status: DC

## 2022-06-01 ENCOUNTER — Other Ambulatory Visit: Payer: Self-pay | Admitting: Nurse Practitioner

## 2022-06-01 DIAGNOSIS — Z1231 Encounter for screening mammogram for malignant neoplasm of breast: Secondary | ICD-10-CM

## 2022-06-17 ENCOUNTER — Ambulatory Visit
Admission: RE | Admit: 2022-06-17 | Discharge: 2022-06-17 | Disposition: A | Discharge status: Home / Self Care | Payer: Medicare Other | Source: Ambulatory Visit | Attending: Nurse Practitioner | Admitting: Nurse Practitioner

## 2022-06-17 DIAGNOSIS — Z1231 Encounter for screening mammogram for malignant neoplasm of breast: Secondary | ICD-10-CM | POA: Diagnosis not present

## 2022-07-14 ENCOUNTER — Other Ambulatory Visit: Payer: Self-pay | Admitting: Nurse Practitioner

## 2022-07-14 DIAGNOSIS — I10 Essential (primary) hypertension: Secondary | ICD-10-CM

## 2022-07-14 DIAGNOSIS — E559 Vitamin D deficiency, unspecified: Secondary | ICD-10-CM

## 2022-07-14 DIAGNOSIS — Z1231 Encounter for screening mammogram for malignant neoplasm of breast: Secondary | ICD-10-CM

## 2022-07-14 DIAGNOSIS — R300 Dysuria: Secondary | ICD-10-CM

## 2022-07-14 DIAGNOSIS — Z0001 Encounter for general adult medical examination with abnormal findings: Secondary | ICD-10-CM

## 2022-07-14 DIAGNOSIS — E782 Mixed hyperlipidemia: Secondary | ICD-10-CM

## 2022-07-14 DIAGNOSIS — Z0189 Encounter for other specified special examinations: Secondary | ICD-10-CM

## 2022-08-08 ENCOUNTER — Encounter: Payer: Self-pay | Admitting: Nurse Practitioner

## 2022-08-08 ENCOUNTER — Ambulatory Visit (INDEPENDENT_AMBULATORY_CARE_PROVIDER_SITE_OTHER): Payer: Medicare Other | Admitting: Nurse Practitioner

## 2022-08-08 VITALS — BP 140/85 | HR 65 | Temp 97.5°F | Resp 16 | Ht 65.0 in | Wt 167.2 lb

## 2022-08-08 DIAGNOSIS — I10 Essential (primary) hypertension: Secondary | ICD-10-CM | POA: Diagnosis not present

## 2022-08-08 DIAGNOSIS — Z9181 History of falling: Secondary | ICD-10-CM | POA: Diagnosis not present

## 2022-08-08 DIAGNOSIS — R2681 Unsteadiness on feet: Secondary | ICD-10-CM

## 2022-08-08 DIAGNOSIS — R7303 Prediabetes: Secondary | ICD-10-CM

## 2022-08-08 LAB — POCT GLYCOSYLATED HEMOGLOBIN (HGB A1C): Hemoglobin A1C: 5.9 % — AB (ref 4.0–5.6)

## 2022-08-08 NOTE — Progress Notes (Signed)
[facility] [place], [place] [address]  Internal MEDICINE  Office Visit Note  Patient Name: Lisa Thompson  [id number]  [id number]  Date of Service: 08/08/2022  Chief Complaint  Patient presents with   Follow-up   Hypertension    HPI Lisa Thompson presents for a follow up visit for prediabetes and hypertension Lost 5 lbs since last visit  Has not been working on diet very well other than on her own.  [place] twice over the summer, wants to go walking but has fallen some.  BP elevated, rechecked and improved   Current Medication: Outpatient Encounter Medications as of 08/08/2022  Medication Sig   amLODipine (NORVASC) 5 MG tablet TAKE 1 TABLET BY MOUTH AT BEDTIME FOR BLOOD PRESSURE   bisoprolol (ZEBETA) 5 MG tablet Take 1 tablet (5 mg total) by mouth daily.   meloxicam (MOBIC) 15 MG tablet Take 1 tablet (15 mg total) by mouth daily.   methocarbamol (ROBAXIN) 500 MG tablet TAKE 1 TABLET BY MOUTH TWICE DAILY   Multiple Vitamins-Minerals (CENTRUM SILVER ADULT 50+) TABS Take 1 tab po daily   No facility-administered encounter medications on file as of 08/08/2022.    Surgical History: Past Surgical History:  Procedure Laterality Date   ABDOMINAL HYSTERECTOMY     CATARACT EXTRACTION W/PHACO Left 12/06/2016   Procedure: CATARACT EXTRACTION PHACO AND INTRAOCULAR LENS PLACEMENT (IOC);  Surgeon: Eulogio Bear, MD;  Location: [place];  Service: Ophthalmology;  Laterality: Left;  IVA Topical LEFT LATEX allergy   CATARACT EXTRACTION W/PHACO Right 01/03/2017   Procedure: CATARACT EXTRACTION PHACO AND INTRAOCULAR LENS PLACEMENT (IOC) RIGHT;  Surgeon: Eulogio Bear, MD;  Location: [place];  Service: Ophthalmology;  Laterality: Right;  IVA TOPICAL latex sensitivity   CYST EXCISION     multiple. in different areas   [place], 2007   TONSILLECTOMY      Medical History: Past Medical History:  Diagnosis Date   Arthritis     lower back   Hypertension    Motion sickness    Prediabetes     Family History: Family History  Problem Relation Age of Onset   Hypertension Mother    Stroke Son    Breast cancer Neg Hx     Social History   Socioeconomic History   Marital status: Single    Spouse name: Not on file   Number of children: Not on file   Years of education: Not on file   Highest education level: Not on file  Occupational History   Not on file  Tobacco Use   Smoking status: Never   Smokeless tobacco: Never  Vaping Use   Vaping Use: Never used  Substance and Sexual Activity   Alcohol use: Not Currently   Drug use: Never   Sexual activity: Not Currently  Other Topics Concern   Not on file  Social History Narrative   Not on file   Social Determinants of Health   Financial Resource Strain: Not on file  Food Insecurity: Not on file  Transportation Needs: Not on file  Physical Activity: Not on file  Stress: Not on file  Social Connections: Not on file  Intimate Partner Violence: Not on file      Review of Systems  Constitutional:  Negative for chills, fatigue and unexpected weight change.  HENT:  Negative for congestion, rhinorrhea, sneezing and sore throat.   Eyes:  Negative for redness.  Respiratory:  Negative for cough, chest tightness and shortness  of breath.   Cardiovascular:  Negative for chest pain and palpitations.  Gastrointestinal:  Negative for abdominal pain, constipation, diarrhea, nausea and vomiting.  Genitourinary:  Negative for dysuria and frequency.  Musculoskeletal:  Negative for arthralgias, back pain, joint swelling and neck pain.  Skin:  Negative for rash.  Neurological: Negative.  Negative for tremors and numbness.  Hematological:  Negative for adenopathy. Does not bruise/bleed easily.  Psychiatric/Behavioral:  Negative for behavioral problems (Depression), sleep disturbance and suicidal ideas. The patient is not nervous/anxious.     Vital Signs: BP (!)  140/85 Comment: 161/77  Pulse 65   Temp (!) 97.5 F (36.4 C)   Resp 16   Ht '5\' 5"'$  (1.651 m)   Wt 167 lb 3.2 oz (75.8 kg)   SpO2 99%   BMI 27.82 kg/m    Physical Exam Vitals reviewed.  Constitutional:      General: She is not in acute distress.    Appearance: Normal appearance. She is not ill-appearing.  HENT:     Head: Normocephalic and atraumatic.  Eyes:     Pupils: Pupils are equal, round, and reactive to light.  Cardiovascular:     Rate and Rhythm: Normal rate and regular rhythm.  Pulmonary:     Effort: Pulmonary effort is normal. No respiratory distress.  Neurological:     Mental Status: She is alert and oriented to person, place, and time.  Psychiatric:        Mood and Affect: Mood normal.        Behavior: Behavior normal.        Assessment/Plan: 1. Prediabetes A1C no change, lost 5 lbs, has not been walking as much, lost paper with diabetic diet choices and meal planning. New meal planning brochure given to patient. Encouraged diet and lifestyle modifications as discussed. Follow up in 3 months for repeat a1c - POCT glycosylated hemoglobin (Hb A1C)  2. Essential hypertension BP stable with current medications, continue as prescribed.   3. Gait instability Cane ordered to aid patient when walking due to history of falls.  - For home use only DME Cane  4. History of falling Cane ordered to decrease risk of falling.  - For home use only DME Cane   General Counseling: Lisa Thompson verbalizes understanding of the findings of todays visit and agrees with plan of treatment. I have discussed any further diagnostic evaluation that may be needed or ordered today. We also reviewed her medications today. she has been encouraged to call the office with any questions or concerns that should arise related to todays visit.    Orders Placed This Encounter  Procedures   For home use only DME Cane   POCT glycosylated hemoglobin (Hb A1C)    No orders of the defined types  were placed in this encounter.   Return in about 3 months (around 11/08/2022) for F/U, Recheck A1C, Deland Slocumb PCP.   Total time spent:30 Minutes Time spent includes review of chart, medications, test results, and follow up plan with the patient.   [place] Controlled Substance Database was reviewed by me.  This patient was seen by Jonetta Osgood, FNP-C in collaboration with Dr. Clayborn Bigness as a part of collaborative care agreement.   Kadisha Goodine R. Valetta Fuller, MSN, FNP-C Internal medicine

## 2022-10-04 ENCOUNTER — Ambulatory Visit: Payer: Medicare HMO

## 2022-10-05 ENCOUNTER — Telehealth: Payer: Self-pay

## 2022-10-05 ENCOUNTER — Encounter: Payer: Self-pay | Admitting: Nurse Practitioner

## 2022-10-05 ENCOUNTER — Ambulatory Visit (INDEPENDENT_AMBULATORY_CARE_PROVIDER_SITE_OTHER): Payer: Medicare HMO | Admitting: Nurse Practitioner

## 2022-10-05 ENCOUNTER — Ambulatory Visit
Admission: RE | Admit: 2022-10-05 | Discharge: 2022-10-05 | Disposition: A | Discharge status: Home / Self Care | Payer: Medicare HMO | Source: Ambulatory Visit | Attending: Nurse Practitioner | Admitting: Nurse Practitioner

## 2022-10-05 ENCOUNTER — Ambulatory Visit
Admission: RE | Admit: 2022-10-05 | Discharge: 2022-10-05 | Disposition: A | Discharge status: Home / Self Care | Payer: Medicare HMO | Attending: Nurse Practitioner | Admitting: Nurse Practitioner

## 2022-10-05 VITALS — BP 147/75 | HR 62 | Temp 97.9°F | Resp 16 | Ht 65.0 in | Wt 167.6 lb

## 2022-10-05 DIAGNOSIS — H6123 Impacted cerumen, bilateral: Secondary | ICD-10-CM | POA: Diagnosis not present

## 2022-10-05 DIAGNOSIS — Z9181 History of falling: Secondary | ICD-10-CM

## 2022-10-05 DIAGNOSIS — M25552 Pain in left hip: Secondary | ICD-10-CM | POA: Diagnosis not present

## 2022-10-05 DIAGNOSIS — R2681 Unsteadiness on feet: Secondary | ICD-10-CM | POA: Diagnosis not present

## 2022-10-05 DIAGNOSIS — M533 Sacrococcygeal disorders, not elsewhere classified: Secondary | ICD-10-CM

## 2022-10-05 MED ORDER — CARBAMIDE PEROXIDE 6.5 % OT SOLN: 5.0000 [drp] | Freq: Two times a day (BID) | OTIC | 0 refills | Status: DC

## 2022-10-05 NOTE — Telephone Encounter (Signed)
Faxed [facility] for cane

## 2022-10-05 NOTE — Progress Notes (Signed)
[facility] [place], [place] [address]  Internal MEDICINE  Office Visit Note  Patient Name: Lisa Thompson  [id number]  [id number]  Date of Service: 10/05/2022  Chief Complaint  Patient presents with   Acute Visit    Ear clogged       HPI Lisa Thompson presents for an acute sick visit for clogged ears and recent fall Clogged ears -- ear wash done but unable to clean out ears.  Recent fall -- left hip and tailbone/low back are hurting, wants xray to make sure nothing is broken.  Needs cane for ambulation to decrease falls.    Current Medication:  Outpatient Encounter Medications as of 10/05/2022  Medication Sig   amLODipine (NORVASC) 5 MG tablet TAKE 1 TABLET BY MOUTH AT BEDTIME FOR BLOOD PRESSURE   bisoprolol (ZEBETA) 5 MG tablet Take 1 tablet (5 mg total) by mouth daily.   carbamide peroxide (DEBROX) 6.5 % OTIC solution Place 5 drops into both ears 2 (two) times daily.   meloxicam (MOBIC) 15 MG tablet Take 1 tablet (15 mg total) by mouth daily.   methocarbamol (ROBAXIN) 500 MG tablet TAKE 1 TABLET BY MOUTH TWICE DAILY   Multiple Vitamins-Minerals (CENTRUM SILVER ADULT 50+) TABS Take 1 tab po daily   No facility-administered encounter medications on file as of 10/05/2022.      Medical History: Past Medical History:  Diagnosis Date   Arthritis    lower back   Hypertension    Motion sickness    Prediabetes      Vital Signs: BP (!) 147/75   Pulse 62   Temp 97.9 F (36.6 C)   Resp 16   Ht '5\' 5"'$  (1.651 m)   Wt 167 lb 9.6 oz (76 kg)   SpO2 96%   BMI 27.89 kg/m    Review of Systems  Constitutional: Negative.  Negative for fatigue.  HENT:  Positive for hearing loss.   Respiratory: Negative.  Negative for cough, chest tightness, shortness of breath and wheezing.   Cardiovascular: Negative.  Negative for chest pain and palpitations.  Musculoskeletal:  Positive for arthralgias, back pain and gait problem.    Physical Exam Vitals  reviewed.  Constitutional:      General: She is not in acute distress.    Appearance: Normal appearance. She is not ill-appearing.  HENT:     Head: Normocephalic and atraumatic.     Right Ear: There is impacted cerumen.     Left Ear: There is impacted cerumen.  Eyes:     Pupils: Pupils are equal, round, and reactive to light.  Musculoskeletal:        General: Tenderness present.     Right hip: Normal.     Left hip: Tenderness present. Decreased range of motion. Decreased strength.  Neurological:     Mental Status: She is alert and oriented to person, place, and time.     Gait: Gait abnormal.  Psychiatric:        Mood and Affect: Mood normal.        Behavior: Behavior normal.       Assessment/Plan: 1. Bilateral impacted cerumen Instructed patient to get OTC debrox drops and use them for about 5 days and then schedule a nurse visit to have ear wash done again. If this still does not work, then ENT will need to do this.  - Ear cerumen removal - carbamide peroxide (DEBROX) 6.5 % OTIC solution; Place 5 drops into both ears 2 (two) times daily.  Dispense: 15 mL; Refill: 0  2. Acute hip pain, left Xray ordered to rule out any acute injury. DME order for cane renewed.  - DG Hip Unilat W OR W/O Pelvis 2-3 Views Left; Future - For home use only DME Cane  3. Coccyx pain Xrays ordered - DG Hip Unilat W OR W/O Pelvis 2-3 Views Left; Future  4. Gait instability Cane ordered for patient - For home use only DME Cane  5. History of recent fall Xrays ordered to rule out acute injury or fracture, cane ordered to prevent future falls. - DG Hip Unilat W OR W/O Pelvis 2-3 Views Left; Future - For home use only DME Cane   General Counseling: Lisa Thompson verbalizes understanding of the findings of todays visit and agrees with plan of treatment. I have discussed any further diagnostic evaluation that may be needed or ordered today. We also reviewed her medications today. she has been encouraged  to call the office with any questions or concerns that should arise related to todays visit.    Counseling:    Orders Placed This Encounter  Procedures   For home use only DME Cane   DG Hip Unilat W OR W/O Pelvis 2-3 Views Left   Ear cerumen removal    Meds ordered this encounter  Medications   carbamide peroxide (DEBROX) 6.5 % OTIC solution    Sig: Place 5 drops into both ears 2 (two) times daily.    Dispense:  15 mL    Refill:  0    Return for previously scheduled, F/U, Lisa Thompson PCP in march.   Controlled Substance Database was reviewed by me for overdose risk score (ORS)  Time spent:30 Minutes Time spent with patient included reviewing progress notes, labs, imaging studies, and discussing plan for follow up.   This patient was seen by Jonetta Osgood, FNP-C in collaboration with Dr. Clayborn Bigness as a part of collaborative care agreement.  Lisa Andre R. Valetta Fuller, MSN, FNP-C Internal Medicine

## 2022-10-06 ENCOUNTER — Encounter: Payer: Self-pay | Admitting: Nurse Practitioner

## 2022-10-06 ENCOUNTER — Telehealth: Payer: Self-pay | Admitting: Nurse Practitioner

## 2022-10-06 NOTE — Telephone Encounter (Signed)
Faxed cane order back to [facility]; [phone]. Scanned-Toni

## 2022-10-13 ENCOUNTER — Other Ambulatory Visit: Payer: Self-pay | Admitting: Nurse Practitioner

## 2022-10-13 DIAGNOSIS — Z1231 Encounter for screening mammogram for malignant neoplasm of breast: Secondary | ICD-10-CM

## 2022-10-13 DIAGNOSIS — I10 Essential (primary) hypertension: Secondary | ICD-10-CM

## 2022-10-13 DIAGNOSIS — Z0189 Encounter for other specified special examinations: Secondary | ICD-10-CM

## 2022-10-13 DIAGNOSIS — E782 Mixed hyperlipidemia: Secondary | ICD-10-CM

## 2022-10-13 DIAGNOSIS — R300 Dysuria: Secondary | ICD-10-CM

## 2022-10-13 DIAGNOSIS — Z0001 Encounter for general adult medical examination with abnormal findings: Secondary | ICD-10-CM

## 2022-10-13 DIAGNOSIS — E559 Vitamin D deficiency, unspecified: Secondary | ICD-10-CM

## 2022-10-17 DIAGNOSIS — R269 Unspecified abnormalities of gait and mobility: Secondary | ICD-10-CM | POA: Diagnosis not present

## 2022-10-17 DIAGNOSIS — R296 Repeated falls: Secondary | ICD-10-CM | POA: Diagnosis not present

## 2022-10-18 NOTE — Progress Notes (Signed)
Please let patient know that no acute fracture or other abnormality was seen on her recent xrays. 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   Please ask her if her pain has been improving since her last office visit?

## 2022-10-19 ENCOUNTER — Telehealth: Payer: Self-pay

## 2022-10-19 NOTE — Telephone Encounter (Signed)
Left message for patient to give office a call.  

## 2022-10-21 DIAGNOSIS — S322XXA Fracture of coccyx, initial encounter for closed fracture: Secondary | ICD-10-CM | POA: Diagnosis not present

## 2022-11-03 DIAGNOSIS — H40003 Preglaucoma, unspecified, bilateral: Secondary | ICD-10-CM | POA: Diagnosis not present

## 2022-11-14 ENCOUNTER — Encounter: Payer: Self-pay | Admitting: Nurse Practitioner

## 2022-11-14 ENCOUNTER — Ambulatory Visit (INDEPENDENT_AMBULATORY_CARE_PROVIDER_SITE_OTHER): Payer: Medicare PPO | Admitting: Nurse Practitioner

## 2022-11-14 VITALS — BP 130/78 | HR 98 | Temp 97.1°F | Resp 16 | Ht 65.0 in | Wt 160.4 lb

## 2022-11-14 DIAGNOSIS — R2681 Unsteadiness on feet: Secondary | ICD-10-CM

## 2022-11-14 DIAGNOSIS — R7303 Prediabetes: Secondary | ICD-10-CM | POA: Diagnosis not present

## 2022-11-14 DIAGNOSIS — I10 Essential (primary) hypertension: Secondary | ICD-10-CM | POA: Diagnosis not present

## 2022-11-14 DIAGNOSIS — H6123 Impacted cerumen, bilateral: Secondary | ICD-10-CM | POA: Diagnosis not present

## 2022-11-14 NOTE — Progress Notes (Signed)
[facility] [place], [place] [address]  Internal MEDICINE  Office Visit Note  Patient Name: Lisa Thompson  [id number]  [id number]  Date of Service: 11/14/2022  Chief Complaint  Patient presents with   Hypertension    HPI Elowyn presents for a follow-up visit for Hypertension -- BP controlled  Prediabetes -- check A1c in 3 more months.  Gait instability -- using cane, has been doing better,  Needs new hearing aids  4.   Still has impacted cerumen bilaterally -- has had a previous unsuccessful ear wash at the clinic and her audiologist informed her that he cannot fit her for hearing aids until she gets her ears cleaned out.     Current Medication: Outpatient Encounter Medications as of 11/14/2022  Medication Sig   amLODipine (NORVASC) 5 MG tablet TAKE 1 TABLET BY MOUTH AT BEDTIME FOR BLOOD PRESSURE   bisoprolol (ZEBETA) 5 MG tablet Take 1 tablet (5 mg total) by mouth daily.   carbamide peroxide (DEBROX) 6.5 % OTIC solution Place 5 drops into both ears 2 (two) times daily.   meloxicam (MOBIC) 15 MG tablet TAKE 1 TABLET BY MOUTH DAILY   methocarbamol (ROBAXIN) 500 MG tablet TAKE 1 TABLET BY MOUTH TWICE DAILY   Multiple Vitamins-Minerals (CENTRUM SILVER ADULT 50+) TABS Take 1 tab po daily   No facility-administered encounter medications on file as of 11/14/2022.    Surgical History: Past Surgical History:  Procedure Laterality Date   ABDOMINAL HYSTERECTOMY     CATARACT EXTRACTION W/PHACO Left 12/06/2016   Procedure: CATARACT EXTRACTION PHACO AND INTRAOCULAR LENS PLACEMENT (IOC);  Surgeon: Eulogio Bear, MD;  Location: [place];  Service: Ophthalmology;  Laterality: Left;  IVA Topical LEFT LATEX allergy   CATARACT EXTRACTION W/PHACO Right 01/03/2017   Procedure: CATARACT EXTRACTION PHACO AND INTRAOCULAR LENS PLACEMENT (IOC) RIGHT;  Surgeon: Eulogio Bear, MD;  Location: [place];  Service: Ophthalmology;  Laterality: Right;   IVA TOPICAL latex sensitivity   CYST EXCISION     multiple. in different areas   [place], 2007   TONSILLECTOMY      Medical History: Past Medical History:  Diagnosis Date   Arthritis    lower back   Hypertension    Motion sickness    Prediabetes     Family History: Family History  Problem Relation Age of Onset   Hypertension Mother    Stroke Son    Breast cancer Neg Hx     Social History   Socioeconomic History   Marital status: Single    Spouse name: Not on file   Number of children: Not on file   Years of education: Not on file   Highest education level: Not on file  Occupational History   Not on file  Tobacco Use   Smoking status: Never   Smokeless tobacco: Never  Vaping Use   Vaping Use: Never used  Substance and Sexual Activity   Alcohol use: Not Currently   Drug use: Never   Sexual activity: Not Currently  Other Topics Concern   Not on file  Social History Narrative   Not on file   Social Determinants of Health   Financial Resource Strain: Not on file  Food Insecurity: Not on file  Transportation Needs: Not on file  Physical Activity: Not on file  Stress: Not on file  Social Connections: Not on file  Intimate Partner Violence: Not on file      Review of Systems  Constitutional: Negative.  Negative for chills, fatigue and unexpected weight change.  HENT:  Positive for hearing loss. Negative for congestion, rhinorrhea, sneezing and sore throat.   Eyes:  Negative for redness.  Respiratory: Negative.  Negative for cough, chest tightness, shortness of breath and wheezing.   Cardiovascular: Negative.  Negative for chest pain and palpitations.  Gastrointestinal:  Negative for abdominal pain, constipation, diarrhea, nausea and vomiting.  Genitourinary:  Negative for dysuria and frequency.  Musculoskeletal:  Positive for arthralgias, back pain and gait problem. Negative for joint swelling and neck pain.  Skin:  Negative for rash.   Neurological:  Negative for tremors and numbness.  Hematological:  Negative for adenopathy. Does not bruise/bleed easily.  Psychiatric/Behavioral:  Negative for behavioral problems (Depression), sleep disturbance and suicidal ideas. The patient is not nervous/anxious.     Vital Signs: BP 130/78 Comment: 157/77  Pulse 98   Temp (!) 97.1 F (36.2 C)   Resp 16   Ht '5\' 5"'$  (1.651 m)   Wt 160 lb 6.4 oz (72.8 kg)   SpO2 98%   BMI 26.69 kg/m    Physical Exam Vitals reviewed.  Constitutional:      General: She is not in acute distress.    Appearance: Normal appearance. She is not ill-appearing.  HENT:     Head: Normocephalic and atraumatic.     Right Ear: There is impacted cerumen.     Left Ear: There is impacted cerumen.  Eyes:     Pupils: Pupils are equal, round, and reactive to light.  Cardiovascular:     Rate and Rhythm: Normal rate and regular rhythm.  Pulmonary:     Effort: Pulmonary effort is normal. No respiratory distress.  Neurological:     Mental Status: She is alert and oriented to person, place, and time.  Psychiatric:        Mood and Affect: Mood normal.        Behavior: Behavior normal.        Assessment/Plan: 1. Essential hypertension Stable, continue medications as prescribed.   2. Prediabetes Stable, will check her A1c in 3 months   3. Gait instability Better, using her cane  4. Bilateral impacted cerumen Referred to ENT for impacted cerumen, need to have this done before she can get her hearing aids fitted  - Ambulatory referral to ENT    General Counseling: Yasemin verbalizes understanding of the findings of todays visit and agrees with plan of treatment. I have discussed any further diagnostic evaluation that may be needed or ordered today. We also reviewed her medications today. she has been encouraged to call the office with any questions or concerns that should arise related to todays visit.    Orders Placed This Encounter  Procedures    Ambulatory referral to ENT    No orders of the defined types were placed in this encounter.   Return in about 3 months (around 02/14/2023) for F/U, Recheck A1C, Ragena Fiola PCP.   Total time spent:30 Minutes Time spent includes review of chart, medications, test results, and follow up plan with the patient.   [place] Controlled Substance Database was reviewed by me.  This patient was seen by Jonetta Osgood, FNP-C in collaboration with Dr. Clayborn Bigness as a part of collaborative care agreement.   Alvon Nygaard R. Valetta Fuller, MSN, FNP-C Internal medicine

## 2022-11-15 ENCOUNTER — Telehealth: Payer: Self-pay | Admitting: Nurse Practitioner

## 2022-11-15 NOTE — Telephone Encounter (Signed)
Otolaryngology referral sent via [facility] to [facility]

## 2022-11-18 DIAGNOSIS — S322XXA Fracture of coccyx, initial encounter for closed fracture: Secondary | ICD-10-CM | POA: Diagnosis not present

## 2022-11-18 DIAGNOSIS — M79671 Pain in right foot: Secondary | ICD-10-CM | POA: Diagnosis not present

## 2022-11-30 ENCOUNTER — Ambulatory Visit (INDEPENDENT_AMBULATORY_CARE_PROVIDER_SITE_OTHER): Payer: Medicare PPO | Admitting: Nurse Practitioner

## 2022-11-30 ENCOUNTER — Encounter: Payer: Self-pay | Admitting: Nurse Practitioner

## 2022-11-30 VITALS — BP 165/95 | HR 65 | Temp 97.4°F | Resp 16 | Ht 65.0 in | Wt 159.0 lb

## 2022-11-30 DIAGNOSIS — I10 Essential (primary) hypertension: Secondary | ICD-10-CM

## 2022-11-30 DIAGNOSIS — H6122 Impacted cerumen, left ear: Secondary | ICD-10-CM

## 2022-11-30 DIAGNOSIS — R2681 Unsteadiness on feet: Secondary | ICD-10-CM

## 2022-11-30 NOTE — Progress Notes (Signed)
[facility] [place], [place] [address]  Internal MEDICINE  Office Visit Note  Patient Name: Lisa Thompson  [id number]  [id number]  Date of Service: 11/30/2022  Chief Complaint  Patient presents with   Follow-up    HPI Lisa Thompson presents for a follow-up visit for ear fill with wax  Went to urgent care, ears washed, partially cleared .  Still waiting for ENT to call,  Has been putting debrox drops in ears to soften up wax, looks broken up  Blood pressure is up, upset about realizing that the bed she has is not the bed she bought in August.  Also Has not been taking her medication. Agreed that she will start taking her medication again today    Current Medication: Outpatient Encounter Medications as of 11/30/2022  Medication Sig   amLODipine (NORVASC) 5 MG tablet TAKE 1 TABLET BY MOUTH AT BEDTIME FOR BLOOD PRESSURE   bisoprolol (ZEBETA) 5 MG tablet Take 1 tablet (5 mg total) by mouth daily.   carbamide peroxide (DEBROX) 6.5 % OTIC solution Place 5 drops into both ears 2 (two) times daily.   meloxicam (MOBIC) 15 MG tablet TAKE 1 TABLET BY MOUTH DAILY   methocarbamol (ROBAXIN) 500 MG tablet TAKE 1 TABLET BY MOUTH TWICE DAILY   Multiple Vitamins-Minerals (CENTRUM SILVER ADULT 50+) TABS Take 1 tab po daily   No facility-administered encounter medications on file as of 11/30/2022.    Surgical History: Past Surgical History:  Procedure Laterality Date   ABDOMINAL HYSTERECTOMY     CATARACT EXTRACTION W/PHACO Left 12/06/2016   Procedure: CATARACT EXTRACTION PHACO AND INTRAOCULAR LENS PLACEMENT (IOC);  Surgeon: Eulogio Bear, MD;  Location: [place];  Service: Ophthalmology;  Laterality: Left;  IVA Topical LEFT LATEX allergy   CATARACT EXTRACTION W/PHACO Right 01/03/2017   Procedure: CATARACT EXTRACTION PHACO AND INTRAOCULAR LENS PLACEMENT (IOC) RIGHT;  Surgeon: Eulogio Bear, MD;  Location: [place];  Service: Ophthalmology;   Laterality: Right;  IVA TOPICAL latex sensitivity   CYST EXCISION     multiple. in different areas   [place], 2007   TONSILLECTOMY      Medical History: Past Medical History:  Diagnosis Date   Arthritis    lower back   Hypertension    Motion sickness    Prediabetes     Family History: Family History  Problem Relation Age of Onset   Hypertension Mother    Stroke Son    Breast cancer Neg Hx     Social History   Socioeconomic History   Marital status: Single    Spouse name: Not on file   Number of children: Not on file   Years of education: Not on file   Highest education level: Not on file  Occupational History   Not on file  Tobacco Use   Smoking status: Never   Smokeless tobacco: Never  Vaping Use   Vaping Use: Never used  Substance and Sexual Activity   Alcohol use: Not Currently   Drug use: Never   Sexual activity: Not Currently  Other Topics Concern   Not on file  Social History Narrative   Not on file   Social Determinants of Health   Financial Resource Strain: Not on file  Food Insecurity: Not on file  Transportation Needs: Not on file  Physical Activity: Not on file  Stress: Not on file  Social Connections: Not on file  Intimate Partner Violence: Not on file  Review of Systems  Constitutional: Negative.  Negative for chills, fatigue and unexpected weight change.  HENT:  Positive for hearing loss. Negative for congestion, rhinorrhea, sneezing and sore throat.   Eyes:  Negative for redness.  Respiratory: Negative.  Negative for cough, chest tightness, shortness of breath and wheezing.   Cardiovascular: Negative.  Negative for chest pain and palpitations.  Gastrointestinal:  Negative for abdominal pain, constipation, diarrhea, nausea and vomiting.  Genitourinary:  Negative for dysuria and frequency.  Musculoskeletal:  Positive for arthralgias, back pain and gait problem. Negative for joint swelling and neck pain.  Skin:   Negative for rash.  Neurological:  Negative for tremors and numbness.  Hematological:  Negative for adenopathy. Does not bruise/bleed easily.  Psychiatric/Behavioral:  Negative for behavioral problems (Depression), sleep disturbance and suicidal ideas. The patient is not nervous/anxious.     Vital Signs: BP (!) 165/95 Comment: 173/82  Pulse 65   Temp (!) 97.4 F (36.3 C)   Resp 16   Ht 5\' 5"  (1.651 m)   Wt 159 lb (72.1 kg)   SpO2 98%   BMI 26.46 kg/m    Physical Exam Vitals reviewed.  Constitutional:      General: She is not in acute distress.    Appearance: Normal appearance. She is not ill-appearing.  HENT:     Head: Normocephalic and atraumatic.     Right Ear: Tympanic membrane, ear canal and external ear normal.     Left Ear: There is impacted cerumen.  Eyes:     Pupils: Pupils are equal, round, and reactive to light.  Cardiovascular:     Rate and Rhythm: Normal rate and regular rhythm.  Pulmonary:     Effort: Pulmonary effort is normal. No respiratory distress.  Neurological:     Mental Status: She is alert and oriented to person, place, and time.  Psychiatric:        Mood and Affect: Mood normal.        Behavior: Behavior normal.        Assessment/Plan: 1. Excessive cerumen in left ear canal Ear lavage left ear, unsuccessful, has ENT referral, given the phone number to call their office.  - Ear Lavage  2. Essential hypertension Elevated but has not been taking medication consistently. BP is controlled when she is taking her medication consistently, she agreed to take her medications as prescribed.   3. Gait instability Has 3 pronged cane now, says it is helping a lot with walking.    General Counseling: Lisa Thompson verbalizes understanding of the findings of todays visit and agrees with plan of treatment. I have discussed any further diagnostic evaluation that may be needed or ordered today. We also reviewed her medications today. she has been encouraged to  call the office with any questions or concerns that should arise related to todays visit.    Orders Placed This Encounter  Procedures   Ear Lavage    No orders of the defined types were placed in this encounter.   Return for previously scheduled, CPE, Lisa Thompson PCP in june .   Total time spent:30 Minutes Time spent includes review of chart, medications, test results, and follow up plan with the patient.   [place] Controlled Substance Database was reviewed by me.  This patient was seen by Jonetta Osgood, FNP-C in collaboration with Dr. Clayborn Bigness as a part of collaborative care agreement.   Lisa Thompson R. Valetta Fuller, MSN, FNP-C Internal medicine

## 2022-12-14 ENCOUNTER — Encounter: Payer: Self-pay | Admitting: Nurse Practitioner

## 2022-12-14 ENCOUNTER — Ambulatory Visit (INDEPENDENT_AMBULATORY_CARE_PROVIDER_SITE_OTHER): Payer: Medicare HMO | Admitting: Nurse Practitioner

## 2022-12-14 VITALS — BP 160/87 | HR 72 | Temp 97.4°F | Resp 16 | Ht 65.0 in | Wt 160.6 lb

## 2022-12-14 DIAGNOSIS — Z79899 Other long term (current) drug therapy: Secondary | ICD-10-CM

## 2022-12-14 DIAGNOSIS — I10 Essential (primary) hypertension: Secondary | ICD-10-CM

## 2022-12-14 DIAGNOSIS — G3184 Mild cognitive impairment, so stated: Secondary | ICD-10-CM

## 2022-12-14 MED ORDER — AMLODIPINE BESYLATE 5 MG PO TABS: ORAL_TABLET | ORAL | 1 refills | Status: DC

## 2022-12-14 MED ORDER — METHOCARBAMOL 500 MG PO TABS: 500.0000 mg | ORAL_TABLET | Freq: Two times a day (BID) | ORAL | 10 refills | Status: DC

## 2022-12-14 MED ORDER — CENTRUM SILVER ADULT 50+ PO TABS: ORAL_TABLET | ORAL | 1 refills | Status: DC

## 2022-12-14 MED ORDER — MELOXICAM 15 MG PO TABS: 15.0000 mg | ORAL_TABLET | Freq: Every day | ORAL | 10 refills | Status: DC

## 2022-12-14 MED ORDER — BISOPROLOL FUMARATE 5 MG PO TABS: 5.0000 mg | ORAL_TABLET | Freq: Every day | ORAL | 1 refills | Status: DC

## 2022-12-14 NOTE — Progress Notes (Signed)
Lisa Thompson, Lisa Thompson [id number]  Internal MEDICINE  Office Visit Note  Patient Name: Lisa Thompson  [id number]  [id number]  Date of Service: 12/14/2022  Chief Complaint  Patient presents with   Follow-up    Referral to neuro for memory loss.     HPI [name] presents for a follow-up visit for memory loss and mild cognitive impairment Increased problems with memory Increased forgetfulness,  More noticeable by family members Switching pharmacies to [facility].  Mother recently passed Losing keys and purse Brother having surgery soon.  Still have stopped up ears, and has not been to the ENT clinic yet.    Current Medication: Outpatient Encounter Medications as of 12/14/2022  Medication Sig   carbamide peroxide (DEBROX) 6.5 % OTIC solution Place 5 drops into both ears 2 (two) times daily.   [DISCONTINUED] amLODipine (NORVASC) 5 MG tablet TAKE 1 TABLET BY MOUTH AT BEDTIME FOR BLOOD PRESSURE   [DISCONTINUED] bisoprolol (ZEBETA) 5 MG tablet Take 1 tablet (5 mg total) by mouth daily.   [DISCONTINUED] meloxicam (MOBIC) 15 MG tablet TAKE 1 TABLET BY MOUTH DAILY   [DISCONTINUED] methocarbamol (ROBAXIN) 500 MG tablet TAKE 1 TABLET BY MOUTH TWICE DAILY   [DISCONTINUED] Multiple Vitamins-Minerals (CENTRUM SILVER ADULT 50+) TABS Take 1 tab po daily   amLODipine (NORVASC) 5 MG tablet TAKE 1 TABLET BY MOUTH AT BEDTIME FOR BLOOD PRESSURE   bisoprolol (ZEBETA) 5 MG tablet Take 1 tablet (5 mg total) by mouth daily.   meloxicam (MOBIC) 15 MG tablet Take 1 tablet (15 mg total) by mouth daily.   methocarbamol (ROBAXIN) 500 MG tablet Take 1 tablet (500 mg total) by mouth 2 (two) times daily.   Multiple Vitamins-Minerals (CENTRUM SILVER ADULT 50+) TABS Take 1 tab po daily   No facility-administered encounter medications on file as of 12/14/2022.    Surgical History: Past Surgical History:  Procedure Laterality Date   ABDOMINAL HYSTERECTOMY     CATARACT  EXTRACTION W/PHACO Left 12/06/2016   Procedure: CATARACT EXTRACTION PHACO AND INTRAOCULAR LENS PLACEMENT (IOC);  Surgeon: Eulogio Bear, MD;  Location: [facility];  Service: Ophthalmology;  Laterality: Left;  IVA Topical LEFT LATEX allergy   CATARACT EXTRACTION W/PHACO Right 01/03/2017   Procedure: CATARACT EXTRACTION PHACO AND INTRAOCULAR LENS PLACEMENT (IOC) RIGHT;  Surgeon: Eulogio Bear, MD;  Location: [facility];  Service: Ophthalmology;  Laterality: Right;  IVA TOPICAL latex sensitivity   CYST EXCISION     multiple. in different areas   [place], 2007   TONSILLECTOMY      Medical History: Past Medical History:  Diagnosis Date   Arthritis    lower back   Hypertension    Motion sickness    Prediabetes     Family History: Family History  Problem Relation Age of Onset   Hypertension Mother    Stroke Son    Breast cancer Neg Hx     Social History   Socioeconomic History   Marital status: Single    Spouse name: Not on file   Number of children: Not on file   Years of education: Not on file   Highest education level: Not on file  Occupational History   Not on file  Tobacco Use   Smoking status: Never   Smokeless tobacco: Never  Vaping Use   Vaping Use: Never used  Substance and Sexual Activity   Alcohol use: Not Currently   Drug use: Never   Sexual activity:  Not Currently  Other Topics Concern   Not on file  Social History Narrative   Not on file   Social Determinants of Health   Financial Resource Strain: Not on file  Food Insecurity: Not on file  Transportation Needs: Not on file  Physical Activity: Not on file  Stress: Not on file  Social Connections: Not on file  Intimate Partner Violence: Not on file      Review of Systems  Constitutional:  Negative for chills, fatigue and unexpected weight change.  HENT:  Negative for congestion, rhinorrhea, sneezing and sore throat.   Eyes:  Negative for redness.   Respiratory:  Negative for cough, chest tightness and shortness of breath.   Cardiovascular:  Negative for chest pain and palpitations.  Gastrointestinal:  Negative for abdominal pain, constipation, diarrhea, nausea and vomiting.  Genitourinary:  Negative for dysuria and frequency.  Musculoskeletal:  Negative for arthralgias, back pain, joint swelling and neck pain.  Skin:  Negative for rash.  Neurological: Negative.  Negative for tremors and numbness.  Hematological:  Negative for adenopathy. Does not bruise/bleed easily.  Psychiatric/Behavioral:  Negative for behavioral problems (Depression), sleep disturbance and suicidal ideas. The patient is not nervous/anxious.     Vital Signs: BP (!) 160/87   Pulse 72   Temp (!) 97.4 F (36.3 C)   Resp 16   Ht 5\' 5"  (1.651 m)   Wt 160 lb 9.6 oz (72.8 kg)   SpO2 97%   BMI 26.73 kg/m    Physical Exam Vitals reviewed.  Constitutional:      General: She is not in acute distress.    Appearance: Normal appearance. She is not ill-appearing.  HENT:     Head: Normocephalic and atraumatic.  Eyes:     Pupils: Pupils are equal, round, and reactive to light.  Cardiovascular:     Rate and Rhythm: Normal rate and regular rhythm.  Pulmonary:     Effort: Pulmonary effort is normal. No respiratory distress.  Neurological:     Mental Status: She is alert and oriented to person, place, and time.  Psychiatric:        Mood and Affect: Mood normal.        Behavior: Behavior normal.        Assessment/Plan: 1. Mild cognitive impairment with memory loss Referred to neurology for further evaluation - Ambulatory referral to Neurology  2. Essential hypertension Medication prescriptions sent to designated pharmacy, patient is switching to [facility].  - amLODipine (NORVASC) 5 MG tablet; TAKE 1 TABLET BY MOUTH AT BEDTIME FOR BLOOD PRESSURE  Dispense: 90 tablet; Refill: 1 - bisoprolol (ZEBETA) 5 MG tablet; Take 1 tablet (5 mg total)  by mouth daily.  Dispense: 90 tablet; Refill: 1  3. Encounter for medication review Reviewed medications and sent new scripts to [facility].  - amLODipine (NORVASC) 5 MG tablet; TAKE 1 TABLET BY MOUTH AT BEDTIME FOR BLOOD PRESSURE  Dispense: 90 tablet; Refill: 1 - bisoprolol (ZEBETA) 5 MG tablet; Take 1 tablet (5 mg total) by mouth daily.  Dispense: 90 tablet; Refill: 1 - meloxicam (MOBIC) 15 MG tablet; Take 1 tablet (15 mg total) by mouth daily.  Dispense: 30 tablet; Refill: 10 - methocarbamol (ROBAXIN) 500 MG tablet; Take 1 tablet (500 mg total) by mouth 2 (two) times daily.  Dispense: 60 tablet; Refill: 10 - Multiple Vitamins-Minerals (CENTRUM SILVER ADULT 50+) TABS; Take 1 tab po daily  Dispense: 90 tablet; Refill: 1   General Counseling: Ashleyann verbalizes  understanding of the findings of todays visit and agrees with plan of treatment. I have discussed any further diagnostic evaluation that may be needed or ordered today. We also reviewed her medications today. she has been encouraged to call the office with any questions or concerns that should arise related to todays visit.    Orders Placed This Encounter  Procedures   Ambulatory referral to Neurology    Meds ordered this encounter  Medications   amLODipine (NORVASC) 5 MG tablet    Sig: TAKE 1 TABLET BY MOUTH AT BEDTIME FOR BLOOD PRESSURE    Dispense:  90 tablet    Refill:  1   bisoprolol (ZEBETA) 5 MG tablet    Sig: Take 1 tablet (5 mg total) by mouth daily.    Dispense:  90 tablet    Refill:  1   meloxicam (MOBIC) 15 MG tablet    Sig: Take 1 tablet (15 mg total) by mouth daily.    Dispense:  30 tablet    Refill:  10   methocarbamol (ROBAXIN) 500 MG tablet    Sig: Take 1 tablet (500 mg total) by mouth 2 (two) times daily.    Dispense:  60 tablet    Refill:  10   Multiple Vitamins-Minerals (CENTRUM SILVER ADULT 50+) TABS    Sig: Take 1 tab po daily    Dispense:  90 tablet    Refill:  1    Return for  previously scheduled, F/U, Jasmarie Coppock PCP in june.   Total time spent:30 Minutes Time spent includes review of chart, medications, test results, and follow up plan with the patient.   [place] Controlled Substance Database was reviewed by me.  This patient was seen by Jonetta Osgood, FNP-C in collaboration with Dr. Clayborn Bigness as a part of collaborative care agreement.   Dallen Bunte R. Valetta Fuller, MSN, FNP-C Internal medicine

## 2022-12-16 ENCOUNTER — Telehealth: Payer: Self-pay | Admitting: Nurse Practitioner

## 2022-12-16 NOTE — Telephone Encounter (Signed)
Neurology referral sent via Proficient to Dr. Shah with [facility] 

## 2022-12-16 NOTE — Telephone Encounter (Signed)
Otolaryngology referral sent via Proficient to Dr. Willeen Cass with [facility]

## 2022-12-19 ENCOUNTER — Telehealth: Payer: Self-pay | Admitting: Nurse Practitioner

## 2022-12-19 NOTE — Telephone Encounter (Signed)
Otolaryngology appointment>> 12/30/22 with [facility]Sheralyn Boatman

## 2022-12-20 ENCOUNTER — Telehealth: Payer: Self-pay | Admitting: Nurse Practitioner

## 2022-12-20 ENCOUNTER — Ambulatory Visit: Payer: Medicare HMO | Admitting: Nurse Practitioner

## 2022-12-20 NOTE — Telephone Encounter (Signed)
Spoke with [facility] she will fax me paperwork for home health aide and pt caretaker know we are working on

## 2022-12-20 NOTE — Telephone Encounter (Signed)
Neurology appointment>> 12/21/22 with Dr. Sherryll Burger with [facility]

## 2022-12-21 ENCOUNTER — Encounter: Payer: Self-pay | Admitting: Nurse Practitioner

## 2022-12-21 DIAGNOSIS — R413 Other amnesia: Secondary | ICD-10-CM | POA: Diagnosis not present

## 2022-12-21 DIAGNOSIS — Z113 Encounter for screening for infections with a predominantly sexual mode of transmission: Secondary | ICD-10-CM | POA: Diagnosis not present

## 2022-12-21 DIAGNOSIS — R6889 Other general symptoms and signs: Secondary | ICD-10-CM | POA: Insufficient documentation

## 2022-12-21 NOTE — Telephone Encounter (Signed)
Faxed [facility] for personal care

## 2022-12-22 ENCOUNTER — Other Ambulatory Visit: Payer: Self-pay | Admitting: Student

## 2022-12-22 DIAGNOSIS — R413 Other amnesia: Secondary | ICD-10-CM

## 2022-12-26 ENCOUNTER — Other Ambulatory Visit: Payer: Self-pay

## 2022-12-26 DIAGNOSIS — Z79899 Other long term (current) drug therapy: Secondary | ICD-10-CM

## 2022-12-26 DIAGNOSIS — H6123 Impacted cerumen, bilateral: Secondary | ICD-10-CM

## 2022-12-26 DIAGNOSIS — I10 Essential (primary) hypertension: Secondary | ICD-10-CM

## 2022-12-26 MED ORDER — AMLODIPINE BESYLATE 5 MG PO TABS: ORAL_TABLET | ORAL | 1 refills | Status: AC

## 2022-12-26 MED ORDER — CARBAMIDE PEROXIDE 6.5 % OT SOLN: 5.0000 [drp] | Freq: Two times a day (BID) | OTIC | 0 refills | Status: DC

## 2022-12-26 MED ORDER — BISOPROLOL FUMARATE 5 MG PO TABS: 5.0000 mg | ORAL_TABLET | Freq: Every day | ORAL | 1 refills | Status: AC

## 2022-12-26 MED ORDER — MELOXICAM 15 MG PO TABS: 15.0000 mg | ORAL_TABLET | Freq: Every day | ORAL | 10 refills | Status: AC

## 2022-12-26 MED ORDER — METHOCARBAMOL 500 MG PO TABS: 500.0000 mg | ORAL_TABLET | Freq: Two times a day (BID) | ORAL | 10 refills | Status: AC

## 2022-12-26 MED ORDER — CENTRUM SILVER ADULT 50+ PO TABS: ORAL_TABLET | ORAL | 1 refills | Status: AC

## 2022-12-29 ENCOUNTER — Other Ambulatory Visit: Payer: Self-pay | Admitting: Student

## 2022-12-29 DIAGNOSIS — R413 Other amnesia: Secondary | ICD-10-CM

## 2023-01-01 ENCOUNTER — Ambulatory Visit
Admission: RE | Admit: 2023-01-01 | Discharge: 2023-01-01 | Disposition: A | Discharge status: Home / Self Care | Payer: Medicare HMO | Source: Ambulatory Visit | Attending: Student | Admitting: Student

## 2023-01-01 DIAGNOSIS — R413 Other amnesia: Secondary | ICD-10-CM | POA: Insufficient documentation

## 2023-02-16 ENCOUNTER — Ambulatory Visit (INDEPENDENT_AMBULATORY_CARE_PROVIDER_SITE_OTHER): Payer: Medicare HMO | Admitting: Nurse Practitioner

## 2023-02-16 ENCOUNTER — Encounter: Payer: Self-pay | Admitting: Nurse Practitioner

## 2023-02-16 VITALS — BP 135/72 | HR 60 | Temp 96.5°F | Resp 16 | Ht 65.0 in | Wt 153.4 lb

## 2023-02-16 DIAGNOSIS — R7303 Prediabetes: Secondary | ICD-10-CM

## 2023-02-16 DIAGNOSIS — G3184 Mild cognitive impairment, so stated: Secondary | ICD-10-CM | POA: Diagnosis not present

## 2023-02-16 DIAGNOSIS — R2681 Unsteadiness on feet: Secondary | ICD-10-CM

## 2023-02-16 DIAGNOSIS — I6930 Unspecified sequelae of cerebral infarction: Secondary | ICD-10-CM | POA: Diagnosis not present

## 2023-02-16 LAB — POCT GLYCOSYLATED HEMOGLOBIN (HGB A1C): Hemoglobin A1C: 6 % — AB (ref 4.0–5.6)

## 2023-02-16 NOTE — Progress Notes (Signed)
[facility] [address] [place], [place] [address]  Internal MEDICINE  Office Visit Note  Patient Name: Lisa Thompson  [id number]  [id number]  Date of Service: 02/16/2023  Chief Complaint  Patient presents with   Hypertension    HPI Bali presents for a follow-up visit for prediabetes, memory loss and unsteady gait.  Prediabetes -- A1c at 6.0, discussed diet modifications again, lost the informational handouts that she was given at a previous office visit.  Discussed MRI -- has had 1 or several remote infarcts since 2015, seems likely that she has had a stroke in the past 9 years with residual symptoms of memory loss, forgetfulness, and unsteady gait. There is no history of the patient having symptoms that were pronounced enough that she went to the ER and was evaluated for a stroke. Has an upcoming office visit with neurology again in July next month.  Using cane due to history of multiple falls and unsteady gait. Forgot her cane today.    Current Medication: Outpatient Encounter Medications as of 02/16/2023  Medication Sig   amLODipine (NORVASC) 5 MG tablet TAKE 1 TABLET BY MOUTH AT BEDTIME FOR BLOOD PRESSURE   bisoprolol (ZEBETA) 5 MG tablet Take 1 tablet (5 mg total) by mouth daily.   carbamide peroxide (DEBROX) 6.5 % OTIC solution Place 5 drops into both ears 2 (two) times daily.   meloxicam (MOBIC) 15 MG tablet Take 1 tablet (15 mg total) by mouth daily.   methocarbamol (ROBAXIN) 500 MG tablet Take 1 tablet (500 mg total) by mouth 2 (two) times daily.   Multiple Vitamins-Minerals (CENTRUM SILVER ADULT 50+) TABS Take 1 tab po daily   No facility-administered encounter medications on file as of 02/16/2023.    Surgical History: Past Surgical History:  Procedure Laterality Date   ABDOMINAL HYSTERECTOMY     CATARACT EXTRACTION W/PHACO Left 12/06/2016   Procedure: CATARACT EXTRACTION PHACO AND INTRAOCULAR LENS PLACEMENT (IOC);  Surgeon: Nevada Crane, MD;   Location: [facility];  Service: Ophthalmology;  Laterality: Left;  IVA Topical LEFT LATEX allergy   CATARACT EXTRACTION W/PHACO Right 01/03/2017   Procedure: CATARACT EXTRACTION PHACO AND INTRAOCULAR LENS PLACEMENT (IOC) RIGHT;  Surgeon: Nevada Crane, MD;  Location: [facility];  Service: Ophthalmology;  Laterality: Right;  IVA TOPICAL latex sensitivity   CYST EXCISION     multiple. in different areas   FOOT SURGERY  1997, 2007   TONSILLECTOMY      Medical History: Past Medical History:  Diagnosis Date   Arthritis    lower back   Hypertension    Motion sickness    Prediabetes     Family History: Family History  Problem Relation Age of Onset   Hypertension Mother    Stroke Son    Breast cancer Neg Hx     Social History   Socioeconomic History   Marital status: Single    Spouse name: Not on file   Number of children: Not on file   Years of education: Not on file   Highest education level: Not on file  Occupational History   Not on file  Tobacco Use   Smoking status: Never   Smokeless tobacco: Never  Vaping Use   Vaping Use: Never used  Substance and Sexual Activity   Alcohol use: Not Currently   Drug use: Never   Sexual activity: Not Currently  Other Topics Concern   Not on file  Social History Narrative   Not on file  Social Determinants of Health   Financial Resource Strain: Not on file  Food Insecurity: Not on file  Transportation Needs: Not on file  Physical Activity: Not on file  Stress: Not on file  Social Connections: Not on file  Intimate Partner Violence: Not on file      Review of Systems  Constitutional:  Negative for chills, fatigue and unexpected weight change.  HENT:  Negative for congestion, rhinorrhea, sneezing and sore throat.   Eyes:  Negative for redness.  Respiratory: Negative.  Negative for cough, chest tightness, shortness of breath and wheezing.   Cardiovascular: Negative.  Negative for chest pain and  palpitations.  Gastrointestinal:  Negative for abdominal pain, constipation, diarrhea, nausea and vomiting.  Genitourinary:  Negative for dysuria and frequency.  Musculoskeletal:  Positive for gait problem. Negative for arthralgias, back pain, joint swelling and neck pain.  Skin:  Negative for rash.  Neurological:  Negative for tremors and numbness.  Hematological:  Negative for adenopathy. Does not bruise/bleed easily.  Psychiatric/Behavioral:  Positive for confusion (memory loss and forgetfulness issues). Negative for behavioral problems (Depression), sleep disturbance and suicidal ideas. The patient is not nervous/anxious.     Vital Signs: BP 135/72 Comment: 150/70  Pulse 60   Temp (!) 96.5 F (35.8 C)   Resp 16   Ht 5\' 5"  (1.651 m)   Wt 153 lb 6.4 oz (69.6 kg)   SpO2 94%   BMI 25.53 kg/m    Physical Exam Vitals reviewed.  Constitutional:      General: She is not in acute distress.    Appearance: Normal appearance. She is not ill-appearing.  HENT:     Head: Normocephalic and atraumatic.  Eyes:     Pupils: Pupils are equal, round, and reactive to light.  Cardiovascular:     Rate and Rhythm: Normal rate and regular rhythm.  Pulmonary:     Effort: Pulmonary effort is normal. No respiratory distress.  Neurological:     Mental Status: She is alert and oriented to person, place, and time.     Motor: Weakness present.     Gait: Gait abnormal.  Psychiatric:        Attention and Perception: She is inattentive.        Mood and Affect: Mood normal.        Speech: Speech is delayed.        Behavior: Behavior is slowed. Behavior is cooperative.        Thought Content: Thought content is not paranoid or delusional. Thought content does not include homicidal or suicidal ideation.        Cognition and Memory: Memory is impaired. She exhibits impaired recent memory.        Assessment/Plan: 1. Prediabetes Discussed diet and lifestyle modifications in detail again and provided  new copies of all information handouts.  - POCT glycosylated hemoglobin (Hb A1C)  2. Gait instability Continue to use cane as instruced   3. Mild cognitive impairment with memory loss Follow up with neurology in july  4. History of stroke with current residual effects Follow up with neurology in july   General Counseling: Anusha verbalizes understanding of the findings of todays visit and agrees with plan of treatment. I have discussed any further diagnostic evaluation that may be needed or ordered today. We also reviewed her medications today. she has been encouraged to call the office with any questions or concerns that should arise related to todays visit.    Orders Placed This Encounter  Procedures   POCT glycosylated hemoglobin (Hb A1C)    No orders of the defined types were placed in this encounter.   Return for previously scheduled, CPE, Aul Mangieri PCP in july.   Total time spent:30 Minutes Time spent includes review of chart, medications, test results, and follow up plan with the patient.   [place] Controlled Substance Database was reviewed by me.  This patient was seen by Sallyanne Kuster, FNP-C in collaboration with Dr. Beverely Risen as a part of collaborative care agreement.   Philip Eckersley R. Tedd Sias, MSN, FNP-C Internal medicine

## 2023-02-18 ENCOUNTER — Encounter: Payer: Self-pay | Admitting: Nurse Practitioner

## 2023-03-07 DIAGNOSIS — H903 Sensorineural hearing loss, bilateral: Secondary | ICD-10-CM | POA: Diagnosis not present

## 2023-03-07 DIAGNOSIS — H6123 Impacted cerumen, bilateral: Secondary | ICD-10-CM | POA: Diagnosis not present

## 2023-03-20 ENCOUNTER — Ambulatory Visit: Payer: Medicare HMO | Admitting: Nurse Practitioner

## 2023-03-22 ENCOUNTER — Ambulatory Visit (INDEPENDENT_AMBULATORY_CARE_PROVIDER_SITE_OTHER): Payer: Medicare HMO | Admitting: Nurse Practitioner

## 2023-03-22 ENCOUNTER — Encounter: Payer: Self-pay | Admitting: Nurse Practitioner

## 2023-03-22 VITALS — BP 120/70 | HR 113 | Temp 98.0°F | Resp 16 | Ht 65.0 in | Wt 155.0 lb

## 2023-03-22 DIAGNOSIS — I10 Essential (primary) hypertension: Secondary | ICD-10-CM

## 2023-03-22 DIAGNOSIS — E782 Mixed hyperlipidemia: Secondary | ICD-10-CM | POA: Diagnosis not present

## 2023-03-22 DIAGNOSIS — Z0001 Encounter for general adult medical examination with abnormal findings: Secondary | ICD-10-CM | POA: Diagnosis not present

## 2023-03-22 DIAGNOSIS — I6930 Unspecified sequelae of cerebral infarction: Secondary | ICD-10-CM | POA: Diagnosis not present

## 2023-03-22 DIAGNOSIS — G3184 Mild cognitive impairment, so stated: Secondary | ICD-10-CM | POA: Diagnosis not present

## 2023-03-22 DIAGNOSIS — R7303 Prediabetes: Secondary | ICD-10-CM | POA: Diagnosis not present

## 2023-03-22 DIAGNOSIS — R300 Dysuria: Secondary | ICD-10-CM | POA: Diagnosis not present

## 2023-03-22 NOTE — Progress Notes (Signed)
[facility] [address] [place], [place] [address]  Internal MEDICINE  Office Visit Note  Patient Name: Lisa Thompson  [id number]  [id number]  Date of Service: 03/22/2023  Chief Complaint  Patient presents with   Hypertension   Medicare Wellness    HPI Lisa Thompson presents for an annual well visit and physical exam.  Well-appearing 77 y.o. female with prediabetes, mild cognitive impairment with memory loss, hypertension, degenerative disc disease, history of falls, high cholesterol, and history of multiple strokes based on recent brain MRI scan.  Routine mammogram: due in October this year  DEXA scan: done in 2018 Labs: due for some updated labs.  New or worsening pain: none  Other concerns: mild cognitive and memory deficits, has an upcoming follow up with neurology next week. Sees ENT regularly for recurrent cerumen impaction and audiology for hearing loss and maintenance of her hearing aids.  Her prediabetes is stable and regularly monitored by PCP.  She is no longer recommended to have regular screening colonoscopies.  She has a small support system of a friend who lives within her building and his daughter. They help her get to her appointments and keep track of her schedule and medications.  Her MMSE score has decreased by a few points which correlates with her brain MRI results showing multiple areas of remote strokes.  She is at high risk of falls due to multiple falls with the past 12 months with some minor injuries. She is on medications that can cause side effects and her balance is impaired requiring use of a cane intermittently.      03/22/2023    9:22 AM 02/03/2022   11:57 AM 03/11/2021    8:58 AM  MMSE - Mini Mental State Exam  Orientation to time 0 4 5  Orientation to Place 5 5 5   Registration 3 3 3   Attention/ Calculation 5 5 5   Recall 3 2 3   Language- name 2 objects 2 2 2   Language- repeat 1 1 1   Language- follow 3 step command 3 3 3    Language- read & follow direction 1 1 1   Write a sentence 1 1 1   Copy design 1 1 1   Total score 25 28 30     Functional Status Survey: Is the patient deaf or have difficulty hearing?: Yes Does the patient have difficulty seeing, even when wearing glasses/contacts?: No Does the patient have difficulty concentrating, remembering, or making decisions?: No Does the patient have difficulty walking or climbing stairs?: No Does the patient have difficulty dressing or bathing?: No Does the patient have difficulty doing errands alone such as visiting a doctor's office or shopping?: Yes     05/05/2022    8:29 AM 08/08/2022    8:18 AM 11/14/2022    1:19 PM 03/22/2023    9:20 AM 03/23/2023    8:08 AM  Fall Risk  Falls in the past year? 1 1 1  0 1  Was there an injury with Fall? 0 0 1 0 1  Fall Risk Category Calculator 1 2 2  0 3  Fall Risk Category (Retired) Low Moderate     (RETIRED) Patient Fall Risk Level  Moderate fall risk     Patient at Risk for Falls Due to    No Fall Risks History of fall(s);Impaired balance/gait;Medication side effect;Impaired mobility  Fall risk Follow up  Falls evaluation completed Falls evaluation completed Falls evaluation completed Falls evaluation completed;Education provided;Falls prevention discussed       03/22/2023  9:21 AM  Depression screen PHQ 2/9  Decreased Interest 0  Down, Depressed, Hopeless 0  PHQ - 2 Score 0       Current Medication: Outpatient Encounter Medications as of 03/22/2023  Medication Sig   amLODipine (NORVASC) 5 MG tablet TAKE 1 TABLET BY MOUTH AT BEDTIME FOR BLOOD PRESSURE   bisoprolol (ZEBETA) 5 MG tablet Take 1 tablet (5 mg total) by mouth daily.   carbamide peroxide (DEBROX) 6.5 % OTIC solution Place 5 drops into both ears 2 (two) times daily.   meloxicam (MOBIC) 15 MG tablet Take 1 tablet (15 mg total) by mouth daily.   methocarbamol (ROBAXIN) 500 MG tablet Take 1 tablet (500 mg total) by mouth 2 (two) times daily.    Multiple Vitamins-Minerals (CENTRUM SILVER ADULT 50+) TABS Take 1 tab po daily   No facility-administered encounter medications on file as of 03/22/2023.    Surgical History: Past Surgical History:  Procedure Laterality Date   ABDOMINAL HYSTERECTOMY     CATARACT EXTRACTION W/PHACO Left 12/06/2016   Procedure: CATARACT EXTRACTION PHACO AND INTRAOCULAR LENS PLACEMENT (IOC);  Surgeon: Nevada Crane, MD;  Location: [facility];  Service: Ophthalmology;  Laterality: Left;  IVA Topical LEFT LATEX allergy   CATARACT EXTRACTION W/PHACO Right 01/03/2017   Procedure: CATARACT EXTRACTION PHACO AND INTRAOCULAR LENS PLACEMENT (IOC) RIGHT;  Surgeon: Nevada Crane, MD;  Location: [facility];  Service: Ophthalmology;  Laterality: Right;  IVA TOPICAL latex sensitivity   CYST EXCISION     multiple. in different areas   FOOT SURGERY  1997, 2007   TONSILLECTOMY      Medical History: Past Medical History:  Diagnosis Date   Arthritis    lower back   Hypertension    Motion sickness    Prediabetes     Family History: Family History  Problem Relation Age of Onset   Hypertension Mother    Stroke Son    Breast cancer Neg Hx     Social History   Socioeconomic History   Marital status: Single    Spouse name: Not on file   Number of children: Not on file   Years of education: Not on file   Highest education level: Not on file  Occupational History   Not on file  Tobacco Use   Smoking status: Never   Smokeless tobacco: Never  Vaping Use   Vaping status: Never Used  Substance and Sexual Activity   Alcohol use: Not Currently   Drug use: Never   Sexual activity: Not Currently  Other Topics Concern   Not on file  Social History Narrative   Not on file   Social Determinants of Health   Financial Resource Strain: Not on file  Food Insecurity: Not on file  Transportation Needs: Not on file  Physical Activity: Not on file  Stress: Not on file  Social Connections:  Not on file  Intimate Partner Violence: Not on file      Review of Systems  Constitutional:  Negative for activity change, appetite change, chills, fatigue, fever and unexpected weight change.  HENT: Negative.  Negative for congestion, ear pain, rhinorrhea, sore throat and trouble swallowing.   Eyes: Negative.   Respiratory: Negative.  Negative for cough, chest tightness, shortness of breath and wheezing.   Cardiovascular: Negative.  Negative for chest pain.  Gastrointestinal: Negative.  Negative for abdominal pain, blood in stool, constipation, diarrhea, nausea and vomiting.  Endocrine: Negative.   Genitourinary: Negative.  Negative for difficulty urinating,  dysuria, frequency, hematuria and urgency.  Musculoskeletal:  Positive for gait problem (balance issues). Negative for arthralgias, back pain, joint swelling, myalgias and neck pain.       History of multiple falls in the past year, uses cane intermittently.  Skin: Negative.  Negative for rash and wound.  Allergic/Immunologic: Negative.  Negative for immunocompromised state.  Neurological:  Positive for dizziness (intermittently and related to gait and balance issues). Negative for seizures, numbness and headaches.  Hematological: Negative.   Psychiatric/Behavioral: Negative.  Negative for behavioral problems, self-injury and suicidal ideas. The patient is not nervous/anxious.     Vital Signs: BP 120/70   Pulse (!) 113   Temp 98 F (36.7 C)   Resp 16   Ht 5\' 5"  (1.651 m)   Wt 155 lb (70.3 kg)   SpO2 97%   BMI 25.79 kg/m    Physical Exam Vitals reviewed.  Constitutional:      General: She is not in acute distress.    Appearance: Normal appearance. She is not ill-appearing.  HENT:     Head: Normocephalic and atraumatic.     Right Ear: Tympanic membrane, ear canal and external ear normal. There is no impacted cerumen.     Left Ear: Tympanic membrane, ear canal and external ear normal. There is no impacted cerumen.      Ears:     Comments: Improved, sees ENT and gets her ears lavaged regularly. Also seeing audiology at their office as well    Nose: Nose normal. No congestion or rhinorrhea.     Mouth/Throat:     Mouth: Mucous membranes are moist.     Pharynx: Oropharynx is clear. No posterior oropharyngeal erythema.  Eyes:     Extraocular Movements: Extraocular movements intact.     Conjunctiva/sclera: Conjunctivae normal.     Pupils: Pupils are equal, round, and reactive to light.  Cardiovascular:     Rate and Rhythm: Normal rate and regular rhythm.     Pulses: Normal pulses.     Heart sounds: Normal heart sounds. No murmur heard. Pulmonary:     Effort: Pulmonary effort is normal. No respiratory distress.     Breath sounds: Normal breath sounds.  Abdominal:     General: Bowel sounds are normal. There is no distension.     Palpations: Abdomen is soft. There is no mass.     Tenderness: There is no abdominal tenderness. There is no guarding or rebound.     Hernia: No hernia is present.  Musculoskeletal:        General: Normal range of motion.     Cervical back: Normal range of motion and neck supple.  Lymphadenopathy:     Cervical: No cervical adenopathy.  Skin:    General: Skin is warm and dry.     Capillary Refill: Capillary refill takes less than 2 seconds.  Neurological:     Mental Status: She is alert and oriented to person, place, and time.  Psychiatric:        Mood and Affect: Mood and affect normal.        Behavior: Behavior is cooperative.        Thought Content: Thought content normal.        Cognition and Memory: Cognition is impaired (sees neurology for cognitive and memory impairment). Memory is impaired. She exhibits impaired recent memory.        Judgment: Judgment normal.        Assessment/Plan: 1. Encounter for routine adult health examination with  abnormal findings Age-appropriate preventive screenings and vaccinations discussed, annual physical exam completed. Routine  labs for health maintenance ordered, see below. PHM updated.  - Lipid Profile - CMP14+EGFR  2. Prediabetes Routine labs ordered, continue diet modifications as previously discussed - Lipid Profile - CMP14+EGFR  3. Essential hypertension Stable, continue bisoprolol and amlodipine as prescribed   4. Mixed hyperlipidemia Routine labs ordered  - Lipid Profile - CMP14+EGFR  5. Mild cognitive impairment with memory loss Following up with neurology  6. History of stroke with current residual effects Per recent brain MRI results, following up with neurology  7. Dysuria Routine urinalysis done  - UA/M w/rflx Culture, Routine     General Counseling: Mikahla verbalizes understanding of the findings of todays visit and agrees with plan of treatment. I have discussed any further diagnostic evaluation that may be needed or ordered today. We also reviewed her medications today. she has been encouraged to call the office with any questions or concerns that should arise related to todays visit.    Orders Placed This Encounter  Procedures   Lipid Profile   CMP14+EGFR   UA/M w/rflx Culture, Routine    No orders of the defined types were placed in this encounter.   Return in about 3 months (around 06/22/2023) for F/U, [place] Grayer PCP.   Total time spent:30 Minutes Time spent includes review of chart, medications, test results, and follow up plan with the patient.   [place] Controlled Substance Database was reviewed by me.  This patient was seen by Sallyanne Kuster, FNP-C in collaboration with Dr. Beverely Risen as a part of collaborative care agreement.  Azael Ragain R. Tedd Sias, MSN, FNP-C Internal medicine

## 2023-03-22 NOTE — Patient Instructions (Signed)
Please get labs drawn at [facility] and make sure to be fasting which means having an empty stomach when getting labs drawn.

## 2023-03-23 ENCOUNTER — Encounter: Payer: Self-pay | Admitting: Nurse Practitioner

## 2023-03-23 LAB — UA/M W/RFLX CULTURE, ROUTINE

## 2023-03-24 DIAGNOSIS — R7303 Prediabetes: Secondary | ICD-10-CM | POA: Diagnosis not present

## 2023-03-24 DIAGNOSIS — E782 Mixed hyperlipidemia: Secondary | ICD-10-CM | POA: Diagnosis not present

## 2023-03-24 DIAGNOSIS — Z0001 Encounter for general adult medical examination with abnormal findings: Secondary | ICD-10-CM | POA: Diagnosis not present

## 2023-03-25 LAB — CMP14+EGFR
ALT: 17 IU/L (ref 0–32)
AST: 19 IU/L (ref 0–40)
Albumin: 4.1 g/dL (ref 3.8–4.8)
Alkaline Phosphatase: 71 IU/L (ref 44–121)
BUN/Creatinine Ratio: 20 (ref 12–28)
BUN: 27 mg/dL (ref 8–27)
Bilirubin Total: 0.5 mg/dL (ref 0.0–1.2)
CO2: 27 mmol/L (ref 20–29)
Calcium: 9.2 mg/dL (ref 8.7–10.3)
Chloride: 104 mmol/L (ref 96–106)
Creatinine, Ser: 1.34 mg/dL — ABNORMAL HIGH (ref 0.57–1.00)
Globulin, Total: 2.9 g/dL (ref 1.5–4.5)
Glucose: 101 mg/dL — ABNORMAL HIGH (ref 70–99)
Potassium: 5.1 mmol/L (ref 3.5–5.2)
Sodium: 144 mmol/L (ref 134–144)
Total Protein: 7 g/dL (ref 6.0–8.5)
eGFR: 41 mL/min/{1.73_m2} — ABNORMAL LOW (ref >59)

## 2023-03-25 LAB — LIPID PANEL
Chol/HDL Ratio: 3.1 ratio (ref 0.0–4.4)
Cholesterol, Total: 232 mg/dL — ABNORMAL HIGH (ref 100–199)
HDL: 75 mg/dL (ref >39)
LDL Chol Calc (NIH): 138 mg/dL — ABNORMAL HIGH (ref 0–99)
Triglycerides: 107 mg/dL (ref 0–149)
VLDL Cholesterol Cal: 19 mg/dL (ref 5–40)

## 2023-03-28 DIAGNOSIS — R413 Other amnesia: Secondary | ICD-10-CM | POA: Diagnosis not present

## 2023-03-28 DIAGNOSIS — E559 Vitamin D deficiency, unspecified: Secondary | ICD-10-CM | POA: Diagnosis not present

## 2023-03-28 DIAGNOSIS — Z8673 Personal history of transient ischemic attack (TIA), and cerebral infarction without residual deficits: Secondary | ICD-10-CM | POA: Diagnosis not present

## 2023-03-30 DIAGNOSIS — H903 Sensorineural hearing loss, bilateral: Secondary | ICD-10-CM | POA: Diagnosis not present

## 2023-04-01 NOTE — Progress Notes (Signed)
Cholesterol and kidney function continue to improve. We will continue to monitor every 6-12 months.

## 2023-04-03 ENCOUNTER — Telehealth: Payer: Self-pay

## 2023-04-03 NOTE — Telephone Encounter (Signed)
Left message for pt to give office a call back. 

## 2023-04-04 ENCOUNTER — Telehealth: Payer: Self-pay

## 2023-04-04 NOTE — Telephone Encounter (Signed)
Spoke with patient regarding lab results. 

## 2023-04-04 NOTE — Telephone Encounter (Signed)
-----   Message from [facility] sent at 04/01/2023 11:14 AM EDT ----- Cholesterol and kidney function continue to improve. We will continue to monitor every 6-12 months.

## 2023-04-05 DIAGNOSIS — I6523 Occlusion and stenosis of bilateral carotid arteries: Secondary | ICD-10-CM | POA: Diagnosis not present

## 2023-04-05 DIAGNOSIS — G459 Transient cerebral ischemic attack, unspecified: Secondary | ICD-10-CM | POA: Diagnosis not present

## 2023-04-06 DIAGNOSIS — Z8673 Personal history of transient ischemic attack (TIA), and cerebral infarction without residual deficits: Secondary | ICD-10-CM | POA: Diagnosis not present

## 2023-04-06 DIAGNOSIS — I10 Essential (primary) hypertension: Secondary | ICD-10-CM | POA: Diagnosis not present

## 2023-04-06 DIAGNOSIS — E782 Mixed hyperlipidemia: Secondary | ICD-10-CM | POA: Diagnosis not present

## 2023-04-07 DIAGNOSIS — H903 Sensorineural hearing loss, bilateral: Secondary | ICD-10-CM | POA: Diagnosis not present

## 2023-04-19 DIAGNOSIS — Z136 Encounter for screening for cardiovascular disorders: Secondary | ICD-10-CM | POA: Diagnosis not present

## 2023-04-19 DIAGNOSIS — I10 Essential (primary) hypertension: Secondary | ICD-10-CM | POA: Diagnosis not present

## 2023-05-04 DIAGNOSIS — H40003 Preglaucoma, unspecified, bilateral: Secondary | ICD-10-CM | POA: Diagnosis not present

## 2023-05-11 DIAGNOSIS — H469 Unspecified optic neuritis: Secondary | ICD-10-CM | POA: Diagnosis not present

## 2023-05-11 DIAGNOSIS — Z961 Presence of intraocular lens: Secondary | ICD-10-CM | POA: Diagnosis not present

## 2023-05-11 DIAGNOSIS — H40003 Preglaucoma, unspecified, bilateral: Secondary | ICD-10-CM | POA: Diagnosis not present

## 2023-05-22 DIAGNOSIS — R002 Palpitations: Secondary | ICD-10-CM | POA: Diagnosis not present

## 2023-05-30 DIAGNOSIS — Z8673 Personal history of transient ischemic attack (TIA), and cerebral infarction without residual deficits: Secondary | ICD-10-CM | POA: Diagnosis not present

## 2023-05-30 DIAGNOSIS — E782 Mixed hyperlipidemia: Secondary | ICD-10-CM | POA: Diagnosis not present

## 2023-05-30 DIAGNOSIS — I10 Essential (primary) hypertension: Secondary | ICD-10-CM | POA: Diagnosis not present

## 2023-06-22 ENCOUNTER — Ambulatory Visit: Payer: Medicare (Managed Care) | Admitting: Nurse Practitioner

## 2024-03-25 ENCOUNTER — Ambulatory Visit: Payer: Medicare (Managed Care) | Admitting: Nurse Practitioner

## 2024-04-16 ENCOUNTER — Telehealth: Payer: Self-pay | Admitting: Nurse Practitioner

## 2024-04-16 NOTE — Telephone Encounter (Signed)
 per Rosaline w/ [facility]. patient now sees pcp @ facility

## 2024-04-23 ENCOUNTER — Ambulatory Visit: Admitting: Nurse Practitioner

## 2024-05-07 NOTE — Telephone Encounter (Signed)
 Referral received and reviewed pt needs to call Urgent care to get seen. Unable to get in contact voicemail is full.

## 2024-09-16 ENCOUNTER — Inpatient Hospital Stay
Admission: EM | Admit: 2024-09-16 | Discharge: 2024-09-19 | DRG: 149 | Disposition: A | Discharge status: Skilled Nursing Facility | Attending: Internal Medicine | Admitting: Internal Medicine

## 2024-09-16 ENCOUNTER — Emergency Department

## 2024-09-16 ENCOUNTER — Other Ambulatory Visit: Payer: Self-pay

## 2024-09-16 ENCOUNTER — Encounter: Payer: Self-pay | Admitting: Hospitalist

## 2024-09-16 DIAGNOSIS — Z961 Presence of intraocular lens: Secondary | ICD-10-CM | POA: Diagnosis present

## 2024-09-16 DIAGNOSIS — Z886 Allergy status to analgesic agent status: Secondary | ICD-10-CM

## 2024-09-16 DIAGNOSIS — N1831 Chronic kidney disease, stage 3a: Secondary | ICD-10-CM | POA: Diagnosis present

## 2024-09-16 DIAGNOSIS — I129 Hypertensive chronic kidney disease with stage 1 through stage 4 chronic kidney disease, or unspecified chronic kidney disease: Secondary | ICD-10-CM | POA: Diagnosis present

## 2024-09-16 DIAGNOSIS — E785 Hyperlipidemia, unspecified: Secondary | ICD-10-CM | POA: Diagnosis present

## 2024-09-16 DIAGNOSIS — Z8249 Family history of ischemic heart disease and other diseases of the circulatory system: Secondary | ICD-10-CM

## 2024-09-16 DIAGNOSIS — H8110 Benign paroxysmal vertigo, unspecified ear: Secondary | ICD-10-CM | POA: Diagnosis present

## 2024-09-16 DIAGNOSIS — G459 Transient cerebral ischemic attack, unspecified: Secondary | ICD-10-CM

## 2024-09-16 DIAGNOSIS — H8109 Meniere's disease, unspecified ear: Secondary | ICD-10-CM

## 2024-09-16 DIAGNOSIS — Z91013 Allergy to seafood: Secondary | ICD-10-CM

## 2024-09-16 DIAGNOSIS — H8120 Vestibular neuronitis, unspecified ear: Secondary | ICD-10-CM | POA: Diagnosis not present

## 2024-09-16 DIAGNOSIS — I10 Essential (primary) hypertension: Secondary | ICD-10-CM | POA: Diagnosis present

## 2024-09-16 DIAGNOSIS — H9193 Unspecified hearing loss, bilateral: Secondary | ICD-10-CM | POA: Diagnosis present

## 2024-09-16 DIAGNOSIS — R001 Bradycardia, unspecified: Secondary | ICD-10-CM | POA: Diagnosis present

## 2024-09-16 DIAGNOSIS — H8112 Benign paroxysmal vertigo, left ear: Principal | ICD-10-CM | POA: Diagnosis present

## 2024-09-16 DIAGNOSIS — Z90710 Acquired absence of both cervix and uterus: Secondary | ICD-10-CM

## 2024-09-16 DIAGNOSIS — E876 Hypokalemia: Secondary | ICD-10-CM | POA: Diagnosis present

## 2024-09-16 DIAGNOSIS — Z91040 Latex allergy status: Secondary | ICD-10-CM

## 2024-09-16 DIAGNOSIS — H5500 Unspecified nystagmus: Secondary | ICD-10-CM | POA: Diagnosis present

## 2024-09-16 DIAGNOSIS — Z8673 Personal history of transient ischemic attack (TIA), and cerebral infarction without residual deficits: Secondary | ICD-10-CM

## 2024-09-16 DIAGNOSIS — Z791 Long term (current) use of non-steroidal anti-inflammatories (NSAID): Secondary | ICD-10-CM

## 2024-09-16 DIAGNOSIS — R4189 Other symptoms and signs involving cognitive functions and awareness: Secondary | ICD-10-CM | POA: Diagnosis present

## 2024-09-16 DIAGNOSIS — Z91018 Allergy to other foods: Secondary | ICD-10-CM

## 2024-09-16 DIAGNOSIS — R42 Dizziness and giddiness: Principal | ICD-10-CM

## 2024-09-16 LAB — CBC WITH DIFFERENTIAL/PLATELET
Abs Immature Granulocytes: 0.02 K/uL (ref 0.00–0.07)
Basophils Absolute: 0 K/uL (ref 0.0–0.1)
Basophils Relative: 0 %
Eosinophils Absolute: 0 K/uL (ref 0.0–0.5)
Eosinophils Relative: 0 %
HCT: 41.4 % (ref 36.0–46.0)
Hemoglobin: 13.5 g/dL (ref 12.0–15.0)
Immature Granulocytes: 0 %
Lymphocytes Relative: 10 %
Lymphs Abs: 0.7 K/uL (ref 0.7–4.0)
MCH: 30.7 pg (ref 26.0–34.0)
MCHC: 32.6 g/dL (ref 30.0–36.0)
MCV: 94.1 fL (ref 80.0–100.0)
Monocytes Absolute: 0.3 K/uL (ref 0.1–1.0)
Monocytes Relative: 4 %
Neutro Abs: 6.5 K/uL (ref 1.7–7.7)
Neutrophils Relative %: 86 %
Platelets: 223 K/uL (ref 150–400)
RBC: 4.4 MIL/uL (ref 3.87–5.11)
RDW: 13.7 % (ref 11.5–15.5)
WBC: 7.5 K/uL (ref 4.0–10.5)
nRBC: 0 % (ref 0.0–0.2)

## 2024-09-16 LAB — PROTIME-INR
INR: 1 (ref 0.8–1.2)
INR: 1 (ref 0.8–1.2)
Prothrombin Time: 14 s (ref 11.4–15.2)
Prothrombin Time: 14.3 s (ref 11.4–15.2)

## 2024-09-16 LAB — COMPREHENSIVE METABOLIC PANEL WITH GFR
ALT: 20 U/L (ref 0–44)
AST: 47 U/L — ABNORMAL HIGH (ref 15–41)
Albumin: 4.5 g/dL (ref 3.5–5.0)
Alkaline Phosphatase: 77 U/L (ref 38–126)
Anion gap: 17 — ABNORMAL HIGH (ref 5–15)
BUN: 17 mg/dL (ref 8–23)
CO2: 23 mmol/L (ref 22–32)
Calcium: 9.6 mg/dL (ref 8.9–10.3)
Chloride: 99 mmol/L (ref 98–111)
Creatinine, Ser: 1.06 mg/dL — ABNORMAL HIGH (ref 0.44–1.00)
GFR, Estimated: 54 mL/min — ABNORMAL LOW
Glucose, Bld: 191 mg/dL — ABNORMAL HIGH (ref 70–99)
Potassium: 4.4 mmol/L (ref 3.5–5.1)
Sodium: 138 mmol/L (ref 135–145)
Total Bilirubin: 0.8 mg/dL (ref 0.0–1.2)
Total Protein: 8 g/dL (ref 6.5–8.1)

## 2024-09-16 LAB — APTT: aPTT: 33 s (ref 24–36)

## 2024-09-16 LAB — CREATININE, SERUM
Creatinine, Ser: 0.88 mg/dL (ref 0.44–1.00)
GFR, Estimated: >60 mL/min

## 2024-09-16 LAB — TROPONIN T, HIGH SENSITIVITY: Troponin T High Sensitivity: <15 ng/L (ref 0–19)

## 2024-09-16 MED ORDER — ACETAMINOPHEN 325 MG PO TABS: 650.0000 mg | ORAL_TABLET | ORAL | Status: DC | PRN

## 2024-09-16 MED ORDER — MECLIZINE HCL 25 MG PO TABS
25.0000 mg | ORAL_TABLET | Freq: Three times a day (TID) | ORAL | Status: DC | PRN
  Administered 2024-09-16: 25 mg via ORAL

## 2024-09-16 MED ORDER — SENNOSIDES-DOCUSATE SODIUM 8.6-50 MG PO TABS: 1.0000 | ORAL_TABLET | Freq: Every evening | ORAL | Status: DC | PRN

## 2024-09-16 MED ORDER — ACETAMINOPHEN 650 MG RE SUPP: 650.0000 mg | RECTAL | Status: DC | PRN

## 2024-09-16 MED ORDER — HYDRALAZINE HCL 20 MG/ML IJ SOLN
10.0000 mg | Freq: Four times a day (QID) | INTRAMUSCULAR | Status: DC | PRN
  Administered 2024-09-19: 10 mg via INTRAVENOUS
  Filled 2024-09-16: qty 1

## 2024-09-16 MED ORDER — ENOXAPARIN SODIUM 40 MG/0.4ML IJ SOSY
40.0000 mg | PREFILLED_SYRINGE | Freq: Every day | INTRAMUSCULAR | Status: DC
  Administered 2024-09-16 – 2024-09-17 (×2): 40 mg via SUBCUTANEOUS
  Filled 2024-09-16 (×2): qty 0.4

## 2024-09-16 MED ORDER — CLOPIDOGREL BISULFATE 75 MG PO TABS
75.0000 mg | ORAL_TABLET | Freq: Every day | ORAL | Status: DC
  Administered 2024-09-16 – 2024-09-19 (×4): 75 mg via ORAL
  Filled 2024-09-16 (×4): qty 1

## 2024-09-16 MED ORDER — AMLODIPINE BESYLATE 5 MG PO TABS
5.0000 mg | ORAL_TABLET | Freq: Every day | ORAL | Status: DC
  Administered 2024-09-16 – 2024-09-18 (×3): 5 mg via ORAL
  Filled 2024-09-16 (×3): qty 1

## 2024-09-16 MED ORDER — MECLIZINE HCL 25 MG PO TABS
12.5000 mg | ORAL_TABLET | Freq: Once | ORAL | Status: DC
  Filled 2024-09-16: qty 1

## 2024-09-16 MED ORDER — BISOPROLOL FUMARATE 5 MG PO TABS
5.0000 mg | ORAL_TABLET | Freq: Every day | ORAL | Status: DC
  Administered 2024-09-16 – 2024-09-18 (×2): 5 mg via ORAL
  Filled 2024-09-16 (×4): qty 1

## 2024-09-16 MED ORDER — SODIUM CHLORIDE 0.9 % IV SOLN: INTRAVENOUS | Status: AC

## 2024-09-16 MED ORDER — AMLODIPINE BESYLATE 5 MG PO TABS: 5.0000 mg | ORAL_TABLET | Freq: Once | ORAL | Status: DC

## 2024-09-16 MED ORDER — STROKE: EARLY STAGES OF RECOVERY BOOK: Freq: Once | Status: AC

## 2024-09-16 MED ORDER — ONDANSETRON HCL 4 MG/2ML IJ SOLN
4.0000 mg | INTRAMUSCULAR | Status: AC
  Administered 2024-09-16: 4 mg via INTRAVENOUS
  Filled 2024-09-16: qty 2

## 2024-09-16 MED ORDER — ACETAMINOPHEN 160 MG/5ML PO SOLN
650.0000 mg | ORAL | Status: DC | PRN
  Filled 2024-09-16: qty 20.3

## 2024-09-16 NOTE — Consult Note (Signed)
 NEUROLOGY CONSULT NOTE   Date of service: September 16, 2024 Patient Name: Lisa Thompson MRN:  [id number] DOB:  [birth year] Chief Complaint: Dizziness Requesting Provider: Dicky Anes, MD  History of Present Illness  Lisa Thompson is a 79 y.o. female with a PMHx of hypertension, hyperlipidemia, arthritis, prediabetes, motion sickness and CVA who was BIB EMS from home with complaints of severe dizziness upon waking up this morning. She denies vomiting, but does have some nausea. Stroke screen was negative with EMS. Initial exam by EDP revealed severe acute non-extinguishing nystagmus. LKN before midnight.     ROS  She denies headache, double vision, limb weakness, incoordination or gait unsteadiness. She does endorse oscillopsia. Comprehensive ROS performed and other pertinent positives documented in HPI    Past History   Past Medical History:  Diagnosis Date   Arthritis    lower back   Hypertension    Motion sickness    Prediabetes   Stroke  Past Surgical History:  Procedure Laterality Date   ABDOMINAL HYSTERECTOMY     CATARACT EXTRACTION W/PHACO Left 12/06/2016   Procedure: CATARACT EXTRACTION PHACO AND INTRAOCULAR LENS PLACEMENT (IOC);  Surgeon: Adine Anes Novak, MD;  Location: [facility];  Service: Ophthalmology;  Laterality: Left;  IVA Topical LEFT LATEX allergy   CATARACT EXTRACTION W/PHACO Right 01/03/2017   Procedure: CATARACT EXTRACTION PHACO AND INTRAOCULAR LENS PLACEMENT (IOC) RIGHT;  Surgeon: Adine Anes Novak, MD;  Location: [facility];  Service: Ophthalmology;  Laterality: Right;  IVA TOPICAL latex sensitivity   CYST EXCISION     multiple. in different areas   FOOT SURGERY  1997, 2007   TONSILLECTOMY      Family History: Family History  Problem Relation Age of Onset   Hypertension Mother    Stroke Son    Breast cancer Neg Hx     Social History  reports that she has never smoked. She has never used smokeless tobacco. She reports that  she does not currently use alcohol. She reports that she does not use drugs.  Allergies[1]  Medications  Current Medications[2]  Vitals   Vitals:   09/16/24 0915 09/16/24 0916 09/16/24 0918 09/16/24 0930  BP:    (!) 187/62  Pulse:    (!) 52  Resp:    (!) 24  Temp:    97.6 F (36.4 C)  TempSrc:    Oral  SpO2: 100% 100%  100%  Weight:   69.9 kg   Height:   5' 5 (1.651 m)     Body mass index is 25.63 kg/m.   Physical Exam   Constitutional: Appears well-developed and well-nourished.  Psych: Somewhat blunted affect.  Eyes: No scleral injection.  HENT: No OP obstruction.  Head: Normocephalic.  Respiratory: Effort normal, non-labored breathing.  Skin: WDI.   Neurologic Examination   Mental Status: Awake and alert. Oriented to being in the hospital, the city and the state, but initially stated that it is Sunday, 2006 and could not remember the month. Speech sparse but fluent without evidence of aphasia.  Able to follow all commands without difficulty. No dyssarthria.  Cranial Nerves: II: Temporal visual fields intact with no extinction to DSS. PERRL. III,IV, VI: No ptosis. EOMI. There is prominent left-beating nystagmus with central gaze and leftward gaze, which attenuates almost completely with rightward gaze.  V: Temp sensation equal bilaterally VII: Smile symmetric VIII: Hearing intact to voice IX,X: No hypophonia or hoarseness XI: Symmetric XII: Midline tongue extension Motor: RUE: 5/5 LUE: 5/5 RLE: 5/5 LLE:  5/5 Sensory: Temp sensation subjectively decreased to RUE.  Deep Tendon Reflexes: 1+ right patellar and brachioradialis. 2+ left patellar and brachioradialis.  Plantars: Right: downgoingLeft: downgoing Cerebellar: Mild ataxia with left FNF and RAM. Mild dysmetria with rhythmic foot-tapping on the left. No ataxia on the right, but with mild action tremor on finger-to-nose.  Gait: Deferred  Labs/Imaging/Neurodiagnostic studies   CBC: No results for  input(s): WBC, NEUTROABS, HGB, HCT, MCV, PLT in the last 168 hours. Basic Metabolic Panel:  Lab Results  Component Value Date   NA 144 03/24/2023   K 5.1 03/24/2023   CO2 27 03/24/2023   GLUCOSE 101 (H) 03/24/2023   BUN 27 03/24/2023   CREATININE 1.34 (H) 03/24/2023   CALCIUM 9.2 03/24/2023   GFRNONAA 58 (L) 02/14/2021   GFRAA 46 (L) 03/10/2020   Lipid Panel:  Lab Results  Component Value Date   LDLCALC 138 (H) 03/24/2023   HgbA1c:  Lab Results  Component Value Date   HGBA1C 6.0 (A) 02/16/2023   Urine Drug Screen: No results found for: LABOPIA, COCAINSCRNUR, LABBENZ, AMPHETMU, THCU, LABBARB  Alcohol Level No results found for: ETH INR No results found for: INR APTT No results found for: APTT AED levels: No results found for: PHENYTOIN, ZONISAMIDE, LAMOTRIGINE, LEVETIRACETA    ASSESSMENT  [name] is a 79 y.o. female with a PMHx of hypertension, hyperlipidemia, arthritis, prediabetes, motion sickness and CVA who was BIB EMS from home with complaints of severe dizziness upon waking up this morning. She denies vomiting, but does have some nausea. Stroke screen was negative with EMS. Initial exam by EDP revealed severe acute non-extinguishing nystagmus. LKN before midnight.  - Exam reveals mild ataxia on the left, in conjunction with left-beating nystagmus that is worse with leftward gaze.  - MRI Brain: No acute intracranial abnormality. - MRA of head: No large vessel occlusion or hemodynamically significant stenosis of the intracranial arteries. - DDx for presentation includes posterior fossa TIA and peripheral vestibulopathy (BPPV would be highest on the DDx, followed by vestibular neuritis or Meniere's disease)  RECOMMENDATIONS  - Has a listed allergy to ASA (nausea and vomiting).  - CTA of head and neck - TTE - Cardiac telemetry - HgbA1c, fasting lipid panel - PT consult, OT consult, Speech consult - Plavix  75 mg po every  day (has an ASA allergy) - Given advanced age, hold off on a statin unless CTA reveals significant atherosclerotic disease - Frequent neuro checks - NPO until passes stroke swallow screen - Trial of meclizine  - If above work up is negative, obtain ENT consult for peripheral vestibulopathy    ______________________________________________________________________    Bonney SHARK, Latrese Carolan, MD [facility]     [1]  Allergies Allergen Reactions   Aspirin Nausea And Vomiting   Latex Itching and Swelling   Other     Heinz Ketchup causes throat to swell   Shellfish Allergy Itching and Swelling    Of mouth and throat  [2] No current facility-administered medications for this encounter.  Current Outpatient Medications:    amLODipine  (NORVASC ) 5 MG tablet, TAKE 1 TABLET BY MOUTH AT BEDTIME FOR BLOOD PRESSURE, Disp: 90 tablet, Rfl: 1   bisoprolol  (ZEBETA ) 5 MG tablet, Take 1 tablet (5 mg total) by mouth daily., Disp: 90 tablet, Rfl: 1   carbamide peroxide (DEBROX) 6.5 % OTIC solution, Place 5 drops into both ears 2 (two) times daily., Disp: 15 mL, Rfl: 0   meloxicam  (MOBIC ) 15 MG tablet, Take 1 tablet (15 mg total) by  mouth daily., Disp: 30 tablet, Rfl: 10   methocarbamol  (ROBAXIN ) 500 MG tablet, Take 1 tablet (500 mg total) by mouth 2 (two) times daily., Disp: 60 tablet, Rfl: 10   Multiple Vitamins-Minerals (CENTRUM SILVER  ADULT 50+) TABS, Take 1 tab po daily, Disp: 90 tablet, Rfl: 1

## 2024-09-16 NOTE — ED Provider Notes (Signed)
 "  [facility] Provider Note    Event Date/Time   First MD Initiated Contact with Patient 09/16/24 [phone]     (approximate)   History   Dizziness   HPI  Lisa Thompson is a 79 y.o. female history of hypertension hyperlipidemia and stroke rather recent per cardiologist in 2024  Patient went to bed last night in normal health.  She got up just before midnight or a little before midnight she is not exactly sure and felt okay.  She went back to bed.  When she woke up this morning she immediately noticed that she felt like she was spinning nauseated and feels like when she opens her eyes everything is spinning around her.  This has never happened before.  There is no headache or neck pain has no other symptoms no chest pain no trouble breathing.  No speech changes no numbness or weakness in the arms or legs that she has noticed.  No rash no change in hearing  Reviewed external records from 2024 by Dr. Dewane  Past Medical History:  Diagnosis Date   Arthritis    lower back   Hypertension    Motion sickness    Prediabetes      Physical Exam   Triage Vital Signs: ED Triage Vitals  Encounter Vitals Group     BP --      Girls Systolic BP Percentile --      Girls Diastolic BP Percentile --      Boys Systolic BP Percentile --      Boys Diastolic BP Percentile --      Pulse --      Resp --      Temp --      Temp src --      SpO2 09/16/24 0915 100 %     Weight 09/16/24 0918 154 lb (69.9 kg)     Height 09/16/24 0918 5' 5 (1.651 m)     Head Circumference --      Peak Flow --      Pain Score --      Pain Loc --      Pain Education --      Exclude from Growth Chart --     Most recent vital signs: Vitals:   09/16/24 0930 09/16/24 1335  BP: (!) 187/62 (!) 192/77  Pulse: (!) 52 (!) 52  Resp: (!) 24 12  Temp: 97.6 F (36.4 C) 98.4 F (36.9 C)  SpO2: 100% 100%     General: Awake, no distress.  She does appear slightly nauseated keeps her eyes  closed CV:   Good peripheral perfusion. Normal rate and heart tones. Resp:   Normal effort. Lung sounds clear bilateral. Speaking without distress. Abd:   No distention. Soft, non-tender to palpation in all quadrants. No rebound or guarding. Neuro:   No focal neuro deficits noted with exception to not extinguishing nystagmus, difficult to determine directionality but she has what appears to be severe not extinguishing nystagmus even with her head at rest.  Her speech is clear moves all extremities well there is no focal deficits.  No pronator drift.  No obvious ataxia in the upper extremities follows commands well.. Moves extremities well without noted concern. Other:     ED Results / Procedures / Treatments   Labs (all labs ordered are listed, but only abnormal results are displayed) Labs Reviewed  COMPREHENSIVE METABOLIC PANEL WITH GFR - Abnormal; Notable for the following components:  Result Value   Glucose, Bld 191 (*)    Creatinine, Ser 1.06 (*)    AST 47 (*)    GFR, Estimated 54 (*)    Anion gap 17 (*)    All other components within normal limits  PROTIME-INR  CBC WITH DIFFERENTIAL/PLATELET  PROTIME-INR  APTT  CBG MONITORING, ED  TROPONIN T, HIGH SENSITIVITY     EKG  I independently reviewed the EKG from 9:30 AM heart rate 50 QRS 120 QTc 469 slight artifact.  Sinus rhythm slight bradycardia.  No evidence of frank ischemia   RADIOLOGY I independently reviewed images of MRI of the brain, to my gross inspection I do not see any obvious large stroke like pathology   MR ANGIO HEAD WO CONTRAST Result Date: 09/16/2024 EXAM: MRI BRAIN WITHOUT CONTRAST MRA HEAD WITHOUT CONTRAST 09/16/2024 12:15:37 PM TECHNIQUE: Multiplanar multisequence MRI of the brain was performed without the administration of intravenous contrast. MRA of the head was performed without contrast using time-of-flight technique. 3D postprocessing with multiplanar reformations and MIPs was performed for better  evaluation of the vasculature. COMPARISON: MRI brain 01/01/2023, CT head 04/16/2022, and MRI brain 06/06/2012. CLINICAL HISTORY: Vertigo, central; possible stroke. FINDINGS: MRI BRAIN: BRAIN AND VENTRICLES: No acute intracranial hemorrhage or acute infarction. There is overall similar mild parenchymal volume loss. Re-demonstrated chronic right frontal lobe infarction. There is overall similar moderate-to-severe subcortical and periventricular T2 and FLAIR signal hyperintensity likely reflecting sequelae of chronic microvascular ischemia. Re-demonstrated chronic bilateral thalamic and basal ganglia infarctions. Chronic right corona radiata infarction. There is also a T2/FLAIR hyperintense intensity within the pons likely on the basis of chronic microvascular ischemia. Redemonstrated couple punctate foci of susceptibility in the right temporal lobe which may relate to the sequela of prior microhemorrhage. No mass effect. No midline shift. No hydrocephalus. The sella is unremarkable. ORBITS: Bilateral lens replacement. SINUSES AND MASTOIDS: Mucosal thickening/retention cysts left maxillary sinus. BONES AND SOFT TISSUES: Normal bone marrow signal. No acute soft tissue abnormality. MRA HEAD: ANTERIOR CIRCULATION: No significant stenosis of the internal carotid arteries. No significant stenosis of the anterior cerebral arteries. No significant stenosis of the middle cerebral arteries. No aneurysm. POSTERIOR CIRCULATION: Hypoplastic left P1 segment in the setting of a left posterior communicating artery. Hypoplastic right intracranial vertebral artery. No significant stenosis of the posterior cerebral arteries. No significant stenosis of the basilar artery. No significant stenosis of the vertebral arteries. No aneurysm. IMPRESSION: 1. MRI Brain: No acute intracranial abnormality. 2. MRA: No large vessel occlusion or hemodynamically significant stenosis of the intracranial arteries. Electronically signed by: Prentice Spade  MD 09/16/2024 01:26 PM EST RP Workstation: [id number]   MR BRAIN WO CONTRAST Result Date: 09/16/2024 EXAM: MRI BRAIN WITHOUT CONTRAST MRA HEAD WITHOUT CONTRAST 09/16/2024 12:15:37 PM TECHNIQUE: Multiplanar multisequence MRI of the brain was performed without the administration of intravenous contrast. MRA of the head was performed without contrast using time-of-flight technique. 3D postprocessing with multiplanar reformations and MIPs was performed for better evaluation of the vasculature. COMPARISON: MRI brain 01/01/2023, CT head 04/16/2022, and MRI brain 06/06/2012. CLINICAL HISTORY: Vertigo, central; possible stroke. FINDINGS: MRI BRAIN: BRAIN AND VENTRICLES: No acute intracranial hemorrhage or acute infarction. There is overall similar mild parenchymal volume loss. Re-demonstrated chronic right frontal lobe infarction. There is overall similar moderate-to-severe subcortical and periventricular T2 and FLAIR signal hyperintensity likely reflecting sequelae of chronic microvascular ischemia. Re-demonstrated chronic bilateral thalamic and basal ganglia infarctions. Chronic right corona radiata infarction. There is also a T2/FLAIR hyperintense intensity within the pons likely  on the basis of chronic microvascular ischemia. Redemonstrated couple punctate foci of susceptibility in the right temporal lobe which may relate to the sequela of prior microhemorrhage. No mass effect. No midline shift. No hydrocephalus. The sella is unremarkable. ORBITS: Bilateral lens replacement. SINUSES AND MASTOIDS: Mucosal thickening/retention cysts left maxillary sinus. BONES AND SOFT TISSUES: Normal bone marrow signal. No acute soft tissue abnormality. MRA HEAD: ANTERIOR CIRCULATION: No significant stenosis of the internal carotid arteries. No significant stenosis of the anterior cerebral arteries. No significant stenosis of the middle cerebral arteries. No aneurysm. POSTERIOR CIRCULATION: Hypoplastic left P1 segment in the setting of  a left posterior communicating artery. Hypoplastic right intracranial vertebral artery. No significant stenosis of the posterior cerebral arteries. No significant stenosis of the basilar artery. No significant stenosis of the vertebral arteries. No aneurysm. IMPRESSION: 1. MRI Brain: No acute intracranial abnormality. 2. MRA: No large vessel occlusion or hemodynamically significant stenosis of the intracranial arteries. Electronically signed by: Prentice Spade MD 09/16/2024 01:26 PM EST RP Workstation: [id number]       PROCEDURES:  Critical Care performed: Yes, see critical care procedure note(s)   CRITICAL CARE Performed by: Oneil Budge   Total critical care time: 35 minutes  Critical care time was exclusive of separately billable procedures and treating other patients.  Critical care was necessary to treat or prevent imminent or life-threatening deterioration.  Critical care was time spent personally by me on the following activities: development of treatment plan with patient and/or surrogate as well as nursing, discussions with consultants, evaluation of patient's response to treatment, examination of patient, obtaining history from patient or surrogate, ordering and performing treatments and interventions, ordering and review of laboratory studies, ordering and review of radiographic studies, pulse oximetry and re-evaluation of patient's condition.   Based on initial presentation high concern for potential central neurologic cause such as acute stroke outside of thrombolytic window.  Rapid neurology consult and imaging obtained. Procedures   MEDICATIONS ORDERED IN ED: Medications  meclizine  (ANTIVERT ) tablet 12.5 mg (has no administration in time range)  ondansetron  (ZOFRAN ) injection 4 mg (has no administration in time range)  amLODipine  (NORVASC ) tablet 5 mg (has no administration in time range)     IMPRESSION / MDM / ASSESSMENT AND PLAN / ED COURSE  I reviewed the triage  vital signs and the nursing notes.                              Based on presentation, the differential diagnosis includes, but is not limited to key considerations: vertigo dizziness DIFFERENTIAL DIAGNOSIS CONSIDERATIONS: High-acuity etiologies considered and prioritized for rule-out: - Hypoglycemia - Hypoxia / Hypercarbia - Sepsis / Meningitis / Encephalitis - Intracranial Hemorrhage / Stroke - Wernicke's Encephalopathy - Hypertensive Encephalopathy - Toxidrome / Withdrawal - Thyroid  Storm / Myxedema  Given her sudden acute presentation and rather not extinguishing component I am concerned about potential central neurologic features.  I have paged Dr. Lindzen, and requested neuro consult.  The patient is outside of thrombolytic window and she has no motor weakness, she has no obvious findings to suggest an obvious large vessel occlusion. VAN negative, per hospital guidelines does not meet LVO activation criteria for stroke at this time.  The etiology is unclear.  We will treat her with Zofran  at this time given her age and also treat with a low-dose of meclizine  if she passes swallow test.  I have ordered CT angio head and neck to  evaluate for possible vertebrobasilar type pathology intracranial hemorrhage etc. and await neurology consult.  No recent new medications no medications on them list that look highly suspicious for etiology  No headache no neck pain no meningismus.  No fever.  No obvious infectious symptomatology.  She does not have a history suggestive of a withdrawal syndrome, no abnormalities of be suggestive of acute thyroid  disease that would cause this type of presentation, etc.  Workup focused on identifying reversible metabolic, infectious, or structural causes.    Patient's presentation is most consistent with acute presentation with potential threat to life or bodily function.    The patient is on the cardiac monitor to evaluate for evidence of arrhythmia and/or  significant heart rate changes.  Clinical Course as of 09/16/24 1401  Mon Sep 16, 2024  1025 Notified by nursing of difficulty establishing IV for angiography.  Access has been established but not sufficient for CT angiogram.  Dr. Merrianne at bedside.  Have called CT to obtain CT head without contrast stat which we will perform.  Plan to work further on obtaining axis but at this juncture I feel like the risks of central venous catheter placement outweigh the benefits at this point especially if we consider the patient is outside of thrombolytic window and she does not have LVO finding by her hospital prescribed Fleeta screen as there is no motor weakness.  Wait for final evaluation/recommendations from Dr. From neurology.  Other modalities such as MRI/MRA would be considered etc. [MQ]  1034 Spoke with Dr. lIndzen, he is seeing the patient at this time and advises she does not have symptoms that would indicate an LVO that would need emergent intervention based on bedside exam, and she is outside thrombolytic window.  This is also my assessment.  I am in agreement.  We will proceed with stat MRI MRA without contrast as even with ultrasound attempts and ultrasound guidance we are not able to establish sufficient access for CT angiogram but MR angiogram can be performed without contrast.  This study is now ordered stat and we are also awaiting results of head CT for which the patient is at now [MQ]  1300 MRI completed awaiting result [MQ]  1348 Patient seen for vertigo and nystagmus is slowly improving.  Remains present but overall improved.  No evidence of stroke on MRI.  Communicating with neurology now for further recommendations, blood pressure is elevated at this point I have no concerns that permissive hypertension needs to occur given the reassuring MRI especially if neurology is in agreement.  Plan to admit the patient start meclizine  Zofran  her symptoms seem severe enough to the point that I think even if  peripheral she should likely be admitted as we continue further workup.  Concern for central process is lowered at this point given the MRI [MQ]  1358 Will treat blood pressure with Norvasc  which the patient takes at home monitor blood pressure.  She has not had her medicine yet today.  Additionally patient being admitted to the hospital service under care of Dr. Puadel, with neurology consult recommendations to follow [MQ]    Clinical Course User Index [MQ] Dicky Anes, MD     FINAL CLINICAL IMPRESSION(S) / ED DIAGNOSES   Final diagnoses:  Vertigo  Nystagmus  Hypertension, unspecified type   Labs reassuring with normal CBC.  No evidence of severe acute departures on metabolic panel.  Troponin normal no chest pain  Rx / DC Orders   ED Discharge Orders  None        Note:  This document was prepared using Dragon voice recognition software and may include unintentional dictation errors.   Dicky Anes, MD 09/16/24 1401  "

## 2024-09-16 NOTE — ED Triage Notes (Signed)
 Pt BIB EMS from home with complaints of dizziness upon waking up this morning. Pt denies NV. Stroke screen neg with EMS. Pt states she also some nausea.

## 2024-09-16 NOTE — H&P (Signed)
 "  History and Physical    Lisa Thompson FMW:[id number] DOB: Jan 20, 1946 DOA: 09/16/2024  DOS: the patient was seen and examined on 09/16/2024  PCP: Pcp, No   Patient coming from: Home  I have personally briefly reviewed patient's old medical records in [facility]  Chief Complaint: Vertigo  HPI: Lisa Thompson is a pleasant 79 y.o. female with medical history significant for  HTN, HLD, Arthritis, pre-diabetes, motion sickness  and prior CVA who was brought in from home with complaints of dizziness upon waking up this morning.  Patient denies any nausea vomiting.  Stroke screen was negative with EMS.  She stated that the room is spinning otherwise no other symptoms.  Patient denies any fever, upper respiratory symptoms, cough, shortness of breath, palpitations, chest pain. No speech changes no numbness or weakness in the arms or legs that she has noticed. No rash no change in hearing.   ED Course: Upon arrival to the ED, patient is found to be hypertensive at 187/62, heart rate 52, respiration 24.  Creatinine was 1.06, EKG shows sinus bradycardia, no evidence of ischemia MRI of the brain was done which showed no obvious stroke.  MRI of the brain shows no large vessel occlusion.  Neurologist evaluated the patient and advised that this could be peripheral vertigo versus TIA.  Hospitalist service was consulted for evaluation for admission.  Review of Systems:  ROS  All other systems negative except as noted in the HPI.  Past Medical History:  Diagnosis Date   Arthritis    lower back   Hypertension    Motion sickness    Prediabetes     Past Surgical History:  Procedure Laterality Date   ABDOMINAL HYSTERECTOMY     CATARACT EXTRACTION W/PHACO Left 12/06/2016   Procedure: CATARACT EXTRACTION PHACO AND INTRAOCULAR LENS PLACEMENT (IOC);  Surgeon: Adine Oneil Novak, MD;  Location: [facility];  Service: Ophthalmology;  Laterality: Left;  IVA Topical LEFT LATEX allergy    CATARACT EXTRACTION W/PHACO Right 01/03/2017   Procedure: CATARACT EXTRACTION PHACO AND INTRAOCULAR LENS PLACEMENT (IOC) RIGHT;  Surgeon: Adine Oneil Novak, MD;  Location: [facility];  Service: Ophthalmology;  Laterality: Right;  IVA TOPICAL latex sensitivity   CYST EXCISION     multiple. in different areas   FOOT SURGERY  1997, 2007   TONSILLECTOMY       reports that she has never smoked. She has never used smokeless tobacco. She reports that she does not currently use alcohol. She reports that she does not use drugs.  Allergies[1]  Family History  Problem Relation Age of Onset   Hypertension Mother    Stroke Son    Breast cancer Neg Hx     Prior to Admission medications  Medication Sig Start Date End Date Taking? Authorizing Provider  amLODipine  (NORVASC ) 5 MG tablet TAKE 1 TABLET BY MOUTH AT BEDTIME FOR BLOOD PRESSURE 12/26/22   Liana Fish, NP  bisoprolol  (ZEBETA ) 5 MG tablet Take 1 tablet (5 mg total) by mouth daily. 12/26/22   Abernathy, Alyssa, NP  carbamide peroxide (DEBROX) 6.5 % OTIC solution Place 5 drops into both ears 2 (two) times daily. 12/26/22   Liana Fish, NP  meloxicam  (MOBIC ) 15 MG tablet Take 1 tablet (15 mg total) by mouth daily. 12/26/22   Liana Fish, NP  methocarbamol  (ROBAXIN ) 500 MG tablet Take 1 tablet (500 mg total) by mouth 2 (two) times daily. 12/26/22   Liana Fish, NP  Multiple Vitamins-Minerals (CENTRUM SILVER  ADULT 50+)  TABS Take 1 tab po daily 12/26/22   Liana Fish, NP    Physical Exam: Vitals:   09/16/24 0918 09/16/24 0930 09/16/24 1335 09/16/24 1425  BP:  (!) 187/62 (!) 192/77 (!) 191/66  Pulse:  (!) 52 (!) 52   Resp:  (!) 24 12   Temp:  97.6 F (36.4 C) 98.4 F (36.9 C)   TempSrc:  Oral Oral   SpO2:  100% 100%   Weight: 69.9 kg     Height: 5' 5 (1.651 m)       Physical Exam   Constitutional: Alert, awake, calm, comfortable HEENT: Neck supple, bilateral horizontal nystagmus present Respiratory:  Clear to auscultation B/L, no wheezing, no rales.  Cardiovascular: Regular rate and rhythm, no murmurs / rubs / gallops. No extremity edema. 2+ pedal pulses. No carotid bruits.  Abdomen: Soft, no tenderness, Bowel sounds positive.  Musculoskeletal: no clubbing / cyanosis. Good ROM, no contractures. Normal muscle tone.  Skin: no rashes, lesions, ulcers. Neurologic: CN 2-12 grossly intact. Sensation intact, No focal deficit identified Psychiatric: Alert and oriented x 3. Normal mood.    Labs on Admission: I have personally reviewed following labs and imaging studies  CBC: Recent Labs  Lab 09/16/24 1031  WBC 7.5  NEUTROABS 6.5  HGB 13.5  HCT 41.4  MCV 94.1  PLT 223   Basic Metabolic Panel: Recent Labs  Lab 09/16/24 0932  NA 138  K 4.4  CL 99  CO2 23  GLUCOSE 191*  BUN 17  CREATININE 1.06*  CALCIUM 9.6   GFR: Estimated Creatinine Clearance: 42.9 mL/min (A) (by C-G formula based on SCr of 1.06 mg/dL (H)). Liver Function Tests: Recent Labs  Lab 09/16/24 0932  AST 47*  ALT 20  ALKPHOS 77  BILITOT 0.8  PROT 8.0  ALBUMIN 4.5   No results for input(s): LIPASE, AMYLASE in the last 168 hours. No results for input(s): AMMONIA in the last 168 hours. Coagulation Profile: Recent Labs  Lab 09/16/24 0932 09/16/24 1122  INR 1.0 1.0   Cardiac Enzymes: No results for input(s): CKTOTAL, CKMB, CKMBINDEX, TROPONINI, TROPONINIHS in the last 168 hours. BNP (last 3 results) No results for input(s): BNP in the last 8760 hours. HbA1C: No results for input(s): HGBA1C in the last 72 hours. CBG: No results for input(s): GLUCAP in the last 168 hours. Lipid Profile: No results for input(s): CHOL, HDL, LDLCALC, TRIG, CHOLHDL, LDLDIRECT in the last 72 hours. Thyroid  Function Tests: No results for input(s): TSH, T4TOTAL, FREET4, T3FREE, THYROIDAB in the last 72 hours. Anemia Panel: No results for input(s): VITAMINB12, FOLATE, FERRITIN,  TIBC, IRON, RETICCTPCT in the last 72 hours. Urine analysis:    Component Value Date/Time   COLORURINE YELLOW (A) 02/14/2021 1003   APPEARANCEUR Cloudy (A) 03/11/2021 0900   LABSPEC 1.015 02/14/2021 1003   PHURINE 7.0 02/14/2021 1003   GLUCOSEU CANCELED 03/22/2023 1601   HGBUR MODERATE (A) 02/14/2021 1003   BILIRUBINUR Negative 03/11/2021 0900   KETONESUR NEGATIVE 02/14/2021 1003   PROTEINUR CANCELED 03/22/2023 1601   PROTEINUR NEGATIVE 02/14/2021 1003   UROBILINOGEN negative (A) 06/22/2020 1012   NITRITE Negative 03/11/2021 0900   NITRITE NEGATIVE 02/14/2021 1003   LEUKOCYTESUR Negative 03/11/2021 0900   LEUKOCYTESUR NEGATIVE 02/14/2021 1003    Radiological Exams on Admission: I have personally reviewed images MR ANGIO HEAD WO CONTRAST Result Date: 09/16/2024 EXAM: MRI BRAIN WITHOUT CONTRAST MRA HEAD WITHOUT CONTRAST 09/16/2024 12:15:37 PM TECHNIQUE: Multiplanar multisequence MRI of the brain was performed without the administration of  intravenous contrast. MRA of the head was performed without contrast using time-of-flight technique. 3D postprocessing with multiplanar reformations and MIPs was performed for better evaluation of the vasculature. COMPARISON: MRI brain 01/01/2023, CT head 04/16/2022, and MRI brain 06/06/2012. CLINICAL HISTORY: Vertigo, central; possible stroke. FINDINGS: MRI BRAIN: BRAIN AND VENTRICLES: No acute intracranial hemorrhage or acute infarction. There is overall similar mild parenchymal volume loss. Re-demonstrated chronic right frontal lobe infarction. There is overall similar moderate-to-severe subcortical and periventricular T2 and FLAIR signal hyperintensity likely reflecting sequelae of chronic microvascular ischemia. Re-demonstrated chronic bilateral thalamic and basal ganglia infarctions. Chronic right corona radiata infarction. There is also a T2/FLAIR hyperintense intensity within the pons likely on the basis of chronic microvascular ischemia.  Redemonstrated couple punctate foci of susceptibility in the right temporal lobe which may relate to the sequela of prior microhemorrhage. No mass effect. No midline shift. No hydrocephalus. The sella is unremarkable. ORBITS: Bilateral lens replacement. SINUSES AND MASTOIDS: Mucosal thickening/retention cysts left maxillary sinus. BONES AND SOFT TISSUES: Normal bone marrow signal. No acute soft tissue abnormality. MRA HEAD: ANTERIOR CIRCULATION: No significant stenosis of the internal carotid arteries. No significant stenosis of the anterior cerebral arteries. No significant stenosis of the middle cerebral arteries. No aneurysm. POSTERIOR CIRCULATION: Hypoplastic left P1 segment in the setting of a left posterior communicating artery. Hypoplastic right intracranial vertebral artery. No significant stenosis of the posterior cerebral arteries. No significant stenosis of the basilar artery. No significant stenosis of the vertebral arteries. No aneurysm. IMPRESSION: 1. MRI Brain: No acute intracranial abnormality. 2. MRA: No large vessel occlusion or hemodynamically significant stenosis of the intracranial arteries. Electronically signed by: Prentice Spade MD 09/16/2024 01:26 PM EST RP Workstation: [id number]   MR BRAIN WO CONTRAST Result Date: 09/16/2024 EXAM: MRI BRAIN WITHOUT CONTRAST MRA HEAD WITHOUT CONTRAST 09/16/2024 12:15:37 PM TECHNIQUE: Multiplanar multisequence MRI of the brain was performed without the administration of intravenous contrast. MRA of the head was performed without contrast using time-of-flight technique. 3D postprocessing with multiplanar reformations and MIPs was performed for better evaluation of the vasculature. COMPARISON: MRI brain 01/01/2023, CT head 04/16/2022, and MRI brain 06/06/2012. CLINICAL HISTORY: Vertigo, central; possible stroke. FINDINGS: MRI BRAIN: BRAIN AND VENTRICLES: No acute intracranial hemorrhage or acute infarction. There is overall similar mild parenchymal volume  loss. Re-demonstrated chronic right frontal lobe infarction. There is overall similar moderate-to-severe subcortical and periventricular T2 and FLAIR signal hyperintensity likely reflecting sequelae of chronic microvascular ischemia. Re-demonstrated chronic bilateral thalamic and basal ganglia infarctions. Chronic right corona radiata infarction. There is also a T2/FLAIR hyperintense intensity within the pons likely on the basis of chronic microvascular ischemia. Redemonstrated couple punctate foci of susceptibility in the right temporal lobe which may relate to the sequela of prior microhemorrhage. No mass effect. No midline shift. No hydrocephalus. The sella is unremarkable. ORBITS: Bilateral lens replacement. SINUSES AND MASTOIDS: Mucosal thickening/retention cysts left maxillary sinus. BONES AND SOFT TISSUES: Normal bone marrow signal. No acute soft tissue abnormality. MRA HEAD: ANTERIOR CIRCULATION: No significant stenosis of the internal carotid arteries. No significant stenosis of the anterior cerebral arteries. No significant stenosis of the middle cerebral arteries. No aneurysm. POSTERIOR CIRCULATION: Hypoplastic left P1 segment in the setting of a left posterior communicating artery. Hypoplastic right intracranial vertebral artery. No significant stenosis of the posterior cerebral arteries. No significant stenosis of the basilar artery. No significant stenosis of the vertebral arteries. No aneurysm. IMPRESSION: 1. MRI Brain: No acute intracranial abnormality. 2. MRA: No large vessel occlusion or hemodynamically significant stenosis of  the intracranial arteries. Electronically signed by: Prentice Spade MD 09/16/2024 01:26 PM EST RP Workstation: [id number]    EKG: My personal interpretation of EKG shows: Sinus bradycardia, obvious no ischemia    Assessment/Plan Principal Problem:   Vertigo, benign positional    Assessment and Plan: 79 year old female with history of hypertension on amlodipine   and bisoprolol , HLD not on any medications, history of prior stroke without any residual, history of motion sickness in the past, prediabetes not on any medications who came into ED complaining of dizziness/vertigo started this morning.  Patient stated that room is spinning.  1.  Vertigo, likely peripheral with nystagmus - Likely benign positional paroxysmal vertigo BPPV - Neurologist input appreciated - Continue meclizine  - Continue neurochecks, tele monitoring, TIA stroke workup - PT/OT/ST 2.  Sinus bradycardia without any chest pain - She takes bisoprolol  daily along with amlodipine  for hypertension. - This could be related to bradycardia but she has a nystagmus I do not believe this dizziness is related to this.  3.  HTN - Continue bisoprolol  and amlodipine  at home dose. - She may need to discuss with primary care about those medications. - I will place hydralazine  for systolic blood pressure more than 160 - But in any case I do not believe this is related to bisoprolol .  She has an nystagmus.  4.  History of stroke  - Supportive care   DVT prophylaxis: Lovenox  Code Status: Full Code Family Communication: Family friend was there Disposition Plan: Home Consults called: Neurology Admission status: Observation, Telemetry bed   Nena Rebel, MD [facility] 09/16/2024, 3:50 PM        [1]  Allergies Allergen Reactions   Aspirin Nausea And Vomiting   Latex Itching and Swelling   Other     Heinz Ketchup causes throat to swell   Shellfish Allergy Itching and Swelling    Of mouth and throat   "

## 2024-09-17 ENCOUNTER — Observation Stay: Admit: 2024-09-17

## 2024-09-17 ENCOUNTER — Observation Stay: Admit: 2024-09-17 | Discharge: 2024-09-17 | Disposition: A | Attending: Hospitalist

## 2024-09-17 DIAGNOSIS — E785 Hyperlipidemia, unspecified: Secondary | ICD-10-CM | POA: Diagnosis present

## 2024-09-17 DIAGNOSIS — Z8673 Personal history of transient ischemic attack (TIA), and cerebral infarction without residual deficits: Secondary | ICD-10-CM | POA: Diagnosis not present

## 2024-09-17 DIAGNOSIS — I129 Hypertensive chronic kidney disease with stage 1 through stage 4 chronic kidney disease, or unspecified chronic kidney disease: Secondary | ICD-10-CM | POA: Diagnosis present

## 2024-09-17 DIAGNOSIS — Z8249 Family history of ischemic heart disease and other diseases of the circulatory system: Secondary | ICD-10-CM | POA: Diagnosis not present

## 2024-09-17 DIAGNOSIS — Z91040 Latex allergy status: Secondary | ICD-10-CM | POA: Diagnosis not present

## 2024-09-17 DIAGNOSIS — N1831 Chronic kidney disease, stage 3a: Secondary | ICD-10-CM | POA: Diagnosis present

## 2024-09-17 DIAGNOSIS — R42 Dizziness and giddiness: Secondary | ICD-10-CM | POA: Diagnosis present

## 2024-09-17 DIAGNOSIS — Z90710 Acquired absence of both cervix and uterus: Secondary | ICD-10-CM | POA: Diagnosis not present

## 2024-09-17 DIAGNOSIS — Z886 Allergy status to analgesic agent status: Secondary | ICD-10-CM | POA: Diagnosis not present

## 2024-09-17 DIAGNOSIS — Z91018 Allergy to other foods: Secondary | ICD-10-CM | POA: Diagnosis not present

## 2024-09-17 DIAGNOSIS — H8112 Benign paroxysmal vertigo, left ear: Secondary | ICD-10-CM | POA: Diagnosis present

## 2024-09-17 DIAGNOSIS — H8113 Benign paroxysmal vertigo, bilateral: Secondary | ICD-10-CM | POA: Diagnosis not present

## 2024-09-17 DIAGNOSIS — Z91013 Allergy to seafood: Secondary | ICD-10-CM | POA: Diagnosis not present

## 2024-09-17 DIAGNOSIS — Z961 Presence of intraocular lens: Secondary | ICD-10-CM | POA: Diagnosis present

## 2024-09-17 DIAGNOSIS — I10 Essential (primary) hypertension: Secondary | ICD-10-CM | POA: Diagnosis not present

## 2024-09-17 DIAGNOSIS — Z791 Long term (current) use of non-steroidal anti-inflammatories (NSAID): Secondary | ICD-10-CM | POA: Diagnosis not present

## 2024-09-17 DIAGNOSIS — H9193 Unspecified hearing loss, bilateral: Secondary | ICD-10-CM | POA: Diagnosis present

## 2024-09-17 DIAGNOSIS — H5500 Unspecified nystagmus: Secondary | ICD-10-CM | POA: Diagnosis present

## 2024-09-17 DIAGNOSIS — E876 Hypokalemia: Secondary | ICD-10-CM | POA: Diagnosis present

## 2024-09-17 DIAGNOSIS — R4189 Other symptoms and signs involving cognitive functions and awareness: Secondary | ICD-10-CM | POA: Diagnosis present

## 2024-09-17 DIAGNOSIS — R001 Bradycardia, unspecified: Secondary | ICD-10-CM | POA: Diagnosis present

## 2024-09-17 LAB — BASIC METABOLIC PANEL WITH GFR
Anion gap: 12 (ref 5–15)
BUN: 17 mg/dL (ref 8–23)
CO2: 28 mmol/L (ref 22–32)
Calcium: 9.1 mg/dL (ref 8.9–10.3)
Chloride: 100 mmol/L (ref 98–111)
Creatinine, Ser: 1.22 mg/dL — ABNORMAL HIGH (ref 0.44–1.00)
GFR, Estimated: 45 mL/min — ABNORMAL LOW
Glucose, Bld: 145 mg/dL — ABNORMAL HIGH (ref 70–99)
Potassium: 3.7 mmol/L (ref 3.5–5.1)
Sodium: 140 mmol/L (ref 135–145)

## 2024-09-17 LAB — HEMOGLOBIN A1C
Hgb A1c MFr Bld: 6 % — ABNORMAL HIGH (ref 4.8–5.6)
Mean Plasma Glucose: 125.5 mg/dL

## 2024-09-17 LAB — PHOSPHORUS: Phosphorus: 2.6 mg/dL (ref 2.5–4.6)

## 2024-09-17 LAB — ECHOCARDIOGRAM COMPLETE
AR max vel: 3.43 cm2
AV Area VTI: 3.49 cm2
AV Area mean vel: 3.4 cm2
AV Mean grad: 2 mmHg
AV Peak grad: 3.8 mmHg
Ao pk vel: 0.98 m/s
Area-P 1/2: 2.24 cm2
Height: 65 in
MV VTI: 2.18 cm2
S' Lateral: 2.3 cm
Weight: 2464 [oz_av]

## 2024-09-17 LAB — LIPID PANEL
Cholesterol: 138 mg/dL (ref 0–200)
HDL: 57 mg/dL
LDL Cholesterol: 66 mg/dL (ref 0–99)
Total CHOL/HDL Ratio: 2.4 ratio
Triglycerides: 73 mg/dL
VLDL: 15 mg/dL (ref 0–40)

## 2024-09-17 LAB — MAGNESIUM: Magnesium: 2.2 mg/dL (ref 1.7–2.4)

## 2024-09-17 LAB — GLUCOSE, CAPILLARY
Glucose-Capillary: 122 mg/dL — ABNORMAL HIGH (ref 70–99)
Glucose-Capillary: 152 mg/dL — ABNORMAL HIGH (ref 70–99)
Glucose-Capillary: 157 mg/dL — ABNORMAL HIGH (ref 70–99)

## 2024-09-17 MED ORDER — INSULIN ASPART 100 UNIT/ML IJ SOLN
0.0000 [IU] | Freq: Three times a day (TID) | INTRAMUSCULAR | Status: DC
  Administered 2024-09-17: 3 [IU] via SUBCUTANEOUS
  Administered 2024-09-17: 2 [IU] via SUBCUTANEOUS
  Administered 2024-09-18: 3 [IU] via SUBCUTANEOUS
  Filled 2024-09-17: qty 3
  Filled 2024-09-17: qty 2
  Filled 2024-09-17: qty 3

## 2024-09-17 NOTE — Final Consult Note (Signed)
 ..Thompson, Lisa [id number] 1946-03-27 Von Bellis, MD  Reason for Consult: vertigo  HPI: 79 year old female brought to ER for concern for stroke like symptoms.  MRI/MRA was negative for acute CVA.  Vertigo with movement only per patient.  Denies prior history of similar but difficult historian due to baseline cognitive impairment.  Prior history of hearing loss with audiogram done at [facility] in 2024 showing mildly asymmetrical hearing loss bilaterally but worse on left side.  Per PT note, patient reported pain on Halpike but patient denied pain to me today.  Reported that she was worked up while in hospital but cannot tell me what she had done.  Patient confused at times and reports unclear of what all has been done while in hospital.  Allergies: Allergies[1]  ROS: Review of systems normal other than 12 systems except per HPI.  PMH:  Past Medical History:  Diagnosis Date   Arthritis    lower back   Hypertension    Motion sickness    Prediabetes     FH:  Family History  Problem Relation Age of Onset   Hypertension Mother    Stroke Son    Breast cancer Neg Hx     SH:  Social History   Socioeconomic History   Marital status: Single    Spouse name: Not on file   Number of children: Not on file   Years of education: Not on file   Highest education level: Not on file  Occupational History   Not on file  Tobacco Use   Smoking status: Never   Smokeless tobacco: Never  Vaping Use   Vaping status: Never Used  Substance and Sexual Activity   Alcohol use: Not Currently   Drug use: Never   Sexual activity: Not Currently  Other Topics Concern   Not on file  Social History Narrative   Not on file   Social Drivers of Health   Tobacco Use: Low Risk (09/16/2024)   Patient History    Smoking Tobacco Use: Never    Smokeless Tobacco Use: Never    Passive Exposure: Not on file  Financial Resource Strain: Not on file  Food Insecurity: No Food Insecurity (09/16/2024)    Epic    Worried About Programme Researcher, Broadcasting/film/video in the Last Year: Never true    Ran Out of Food in the Last Year: Never true  Transportation Needs: No Transportation Needs (09/16/2024)   Epic    Lack of Transportation (Medical): No    Lack of Transportation (Non-Medical): No  Physical Activity: Not on file  Stress: Not on file  Social Connections: Moderately Integrated (09/16/2024)   Social Connection and Isolation Panel    Frequency of Communication with Friends and Family: More than three times a week    Frequency of Social Gatherings with Friends and Family: More than three times a week    Attends Religious Services: More than 4 times per year    Active Member of [employer] or Organizations: No    Attends Banker Meetings: Never    Marital Status: Living with partner  Intimate Partner Violence: Not At Risk (09/16/2024)   Epic    Fear of Current or Ex-Partner: No    Emotionally Abused: No    Physically Abused: No    Sexually Abused: No  Depression (PHQ2-9): Low Risk (03/22/2023)   Depression (PHQ2-9)    PHQ-2 Score: 0  Alcohol Screen: Low Risk (02/03/2022)   Alcohol Screen    Last  Alcohol Screening Score (AUDIT): 0  Housing: Low Risk (09/16/2024)   Epic    Unable to Pay for Housing in the Last Year: No    Number of Times Moved in the Last Year: 0    Homeless in the Last Year: No  Utilities: Not At Risk (09/16/2024)   Epic    Threatened with loss of utilities: No  Health Literacy: Not on file    PSH:  Past Surgical History:  Procedure Laterality Date   ABDOMINAL HYSTERECTOMY     CATARACT EXTRACTION W/PHACO Left 12/06/2016   Procedure: CATARACT EXTRACTION PHACO AND INTRAOCULAR LENS PLACEMENT (IOC);  Surgeon: Adine Oneil Novak, MD;  Location: [facility];  Service: Ophthalmology;  Laterality: Left;  IVA Topical LEFT LATEX allergy   CATARACT EXTRACTION W/PHACO Right 01/03/2017   Procedure: CATARACT EXTRACTION PHACO AND INTRAOCULAR LENS PLACEMENT (IOC) RIGHT;  Surgeon:  Adine Oneil Novak, MD;  Location: [facility];  Service: Ophthalmology;  Laterality: Right;  IVA TOPICAL latex sensitivity   CYST EXCISION     multiple. in different areas   FOOT SURGERY  1997, 2007   TONSILLECTOMY      Physical  Exam:  GEN-  NAD, talking on phone sitting upright in bed NEURO-  CN 2-12 grossly intact and symmetric. HEARING-  Tuning fork did not lateralize and air greater than bone bilaterally EYE-  EOMI.  Some horizontal nystagmus with lateral gaze.  Rotary nystagmus present when patient turned head to left side during exam.  PERRLA NOSE-  clear anteriorly EAR-  right with non-occlusive but moderate cerumen impaction.  Left clear TMs normal OC/OP. Oral cavity, lips, gums, ororpharynx normal with no masses or lesions. EXT- Skin warm and dry. Nasal cavity without polyps or purulence.  NECK. Neck supple with no masses or lesions. No lymphadenopathy palpated. Thyroid  normal with no masses. CARD-  regular RESP- unlabored    MRI BRAIN: BRAIN AND VENTRICLES: No acute intracranial hemorrhage or acute infarction. There is overall similar mild parenchymal volume loss. Re-demonstrated chronic right frontal lobe infarction. There is overall similar moderate-to-severe subcortical and periventricular T2 and FLAIR signal hyperintensity likely reflecting sequelae of chronic microvascular ischemia. Re-demonstrated chronic bilateral thalamic and basal ganglia infarctions. Chronic right corona radiata infarction. There is also a T2/FLAIR hyperintense intensity within the pons likely on the basis of chronic microvascular ischemia. Redemonstrated couple punctate foci of susceptibility in the right temporal lobe which may relate to the sequela of prior microhemorrhage    A/P: Vertigo with no acute findings on MRI/MRA and some findings consistent with BPPV on exam/PT exam but limited tolerance by patient to complete HP  Plan:  Discussed findings with patient regarding  findings.  Recommend upon discharge for patient to follow up with [facility] for formal Hallpike on balance bed with video gogglesv as well as microscopic cleaning of ear canals.  While inpatient, agree with Physical therapy working with patient.  If negative for BPPV will need formal Videonystagmography to determine if central vs peripheral given chronic changes on MRI.   Carolee Hunter 09/17/2024 6:31 PM       [1]  Allergies Allergen Reactions   Aspirin Nausea And Vomiting   Latex Itching and Swelling   Other     Heinz Ketchup causes throat to swell   Shellfish Allergy Itching and Swelling    Of mouth and throat

## 2024-09-17 NOTE — Progress Notes (Signed)
 Physical Therapy Treatment Patient Details Name: Lisa Thompson MRN: [id number] DOB: [birth year] Today's Date: 09/17/2024   History of Present Illness 79 y.o. female with medical history significant for  HTN, HLD, Arthritis, pre-diabetes, motion sickness  and prior CVA who was brought in from home with complaints of dizziness upon waking up this morning.    PT Comments  Pt seen for PT tx with pt agreeable. Pt with slight L/upwards beating nystagmus at rest. Attempted Dix Hallpike maneuver but pt does not tolerate 2/2 increased pain/sensation in L side of neck below ear. Transitioned to Semont maneuver with pt noting increased dizziness with head rotated to L & transitioning sitting>R sidelying>L sidelying (pt noting more dizziness during L sidelying position). Attempted maneuver 3 times with no improvement, pt also inconsistent with her responses. Notified MD of events of session. Will continue to follow pt acutely.   If plan is discharge home, recommend the following: A little help with walking and/or transfers;A little help with bathing/dressing/bathroom;Assistance with cooking/housework;Assist for transportation   Can travel by private vehicle        Equipment Recommendations  Rolling walker (2 wheels)    Recommendations for Other Services       Precautions / Restrictions Precautions Precautions: Fall Restrictions Weight Bearing Restrictions Per Provider Order: No     Mobility  Bed Mobility Overal bed mobility: Modified Independent       Supine to sit: HOB elevated          Transfers                        Ambulation/Gait                   Stairs             Wheelchair Mobility     Tilt Bed    Modified Rankin (Stroke Patients Only)       Balance Overall balance assessment: Needs assistance Sitting-balance support: Single extremity supported, Bilateral upper extremity supported, Feet supported                                         Communication Communication Communication: No apparent difficulties  Cognition Arousal: Alert Behavior During Therapy: WFL for tasks assessed/performed   PT - Cognitive impairments: Memory, Awareness, Safety/Judgement                       PT - Cognition Comments: pt with inconsistent reports during session Following commands: Intact      Cueing Cueing Techniques: Verbal cues  Exercises      General Comments        Pertinent Vitals/Pain Pain Assessment Pain Assessment: Faces Faces Pain Scale: Hurts little more Pain Location: L side of neck Pain Descriptors / Indicators: Discomfort Pain Intervention(s): Monitored during session, Repositioned, Limited activity within patient's tolerance    Home Living Family/patient expects to be discharged to:: Private residence Living Arrangements: Alone   Type of Home: Apartment Home Access: Elevator       Home Layout: One level Home Equipment: Cane - single point      Prior Function            PT Goals (current goals can now be found in the care plan section) Acute Rehab PT Goals Patient Stated Goal: to reduce dizziness PT Goal Formulation: With patient Time For  Goal Achievement: 10/01/24 Potential to Achieve Goals: Good Progress towards PT goals: Progressing toward goals    Frequency    Min 2X/week      PT Plan      Co-evaluation PT/OT/SLP Co-Evaluation/Treatment: Yes Reason for Co-Treatment: Complexity of the patient's impairments (multi-system involvement);For patient/therapist safety;To address functional/ADL transfers PT goals addressed during session: Mobility/safety with mobility;Balance;Proper use of DME OT goals addressed during session: ADL's and self-care      AM-PAC PT 6 Clicks Mobility   Outcome Measure  Help needed turning from your back to your side while in a flat bed without using bedrails?: None Help needed moving from lying on your back to sitting on the  side of a flat bed without using bedrails?: A Little Help needed moving to and from a bed to a chair (including a wheelchair)?: A Little Help needed standing up from a chair using your arms (e.g., wheelchair or bedside chair)?: A Little Help needed to walk in hospital room?: A Little Help needed climbing 3-5 steps with a railing? : A Little 6 Click Score: 19    End of Session   Activity Tolerance: Patient tolerated treatment well Patient left: in bed;with bed alarm set;with call bell/phone within reach Nurse Communication: Mobility status PT Visit Diagnosis: Unsteadiness on feet (R26.81);Muscle weakness (generalized) (M62.81);Dizziness and giddiness (R42)     Time: 8481-8447 PT Time Calculation (min) (ACUTE ONLY): 34 min  Charges:    $Therapeutic Activity: 8-22 mins $Canalith Rep Proc: 8-22 mins PT General Charges $$ ACUTE PT VISIT: 1 Visit                     Richerd Pinal, PT, DPT 09/17/2024, 4:06 PM   Richerd CHRISTELLA Pinal 09/17/2024, 4:04 PM

## 2024-09-17 NOTE — Progress Notes (Signed)
*  PRELIMINARY RESULTS* Echocardiogram 2D Echocardiogram has been performed.  Floydene Harder 09/17/2024, 9:38 AM

## 2024-09-17 NOTE — Evaluation (Signed)
 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   Abnormal renal function 04/26/2019   Mixed hyperlipidemia 04/26/2019   Degenerative disc disease, lumbar 03/03/2019   Pruritic dermatitis 03/03/2019   Urinary tract infection with hematuria 02/27/2018   Dysuria 02/27/2018   Encounter for general adult medical examination with abnormal findings 02/27/2018   Vitamin D  deficiency 02/27/2018   Benign hypertension 10/30/2017   Cervical disc disorder at C5-C6 level with radiculopathy 10/30/2017   Past Medical History:  Past Medical History:  Diagnosis Date   Arthritis    lower back   Hypertension    Motion sickness    Prediabetes    Past Surgical History:  Past Surgical History:  Procedure Laterality Date   ABDOMINAL HYSTERECTOMY     CATARACT EXTRACTION W/PHACO Left 12/06/2016   Procedure: CATARACT EXTRACTION PHACO AND INTRAOCULAR LENS PLACEMENT (IOC);  Surgeon: Adine Oneil Novak, MD;  Location: [facility];  Service: Ophthalmology;  Laterality: Left;  IVA Topical LEFT LATEX allergy   CATARACT EXTRACTION W/PHACO Right 01/03/2017   Procedure: CATARACT EXTRACTION PHACO AND INTRAOCULAR LENS PLACEMENT (IOC) RIGHT;  Surgeon: Adine Oneil Novak, MD;  Location: [facility];  Service: Ophthalmology;  Laterality: Right;  IVA TOPICAL latex sensitivity   CYST EXCISION     multiple. in different areas   FOOT SURGERY  1997, 2007   TONSILLECTOMY     HPI:  Pt is  a 79 y.o. female with medical history significant for  HTN, HLD, Arthritis, pre-diabetes, motion sickness  and prior CVA who was brought in from home to the ED with complaints of dizziness upon waking up this morning.  Pt is followed by the [facility] per report.  Patient denies any nausea vomiting.  Stroke screen was negative with EMS.  She stated that the room is spinning otherwise no other symptoms.  Patient denies any fever, upper respiratory symptoms, cough, shortness of breath, palpitations, chest pain.  No speech changes no numbness or weakness in the arms or legs.   Per chart, MRI of the brain was done which showed no obvious stroke; no large vessel occlusion.  Neurologist evaluated the patient and advised that this could be peripheral vertigo versus TIA.   Pt appeared mildly HOH at bedside.  OF NOTE: MRI in 2024 revealed: Mild to Moderately Advanced cerebral volume loss for age, without  disproportionate lobar atrophy.  Remote infarct in the right frontal lobe.  Per chart, pt has Memory Loss - see Neuro eval in 2024.    Assessment / Plan / Recommendation Clinical Impression   Pt was seen for informal assessment of Cognitive-communication skills at bedside this morning. Pt awake, sitting in chair feeding self her breakfast meal. She was verbal and gave general information re: self. Noted Orientation was poor. Per chart review, pt has a Baseline h/o Cognitive decline- Memory Impairment per MD note (Neuro evaluation 2024). TOC also confirmed this stating that pt's [facility] program works w/ her and gives support. Pt lives Alone.  Baseline of Memory Impairment per  chart.     Patient appears to present with Moderate Cognitive-communication impairments characterized by deficits in attention and memory which can contribute to decreased function in higher level skills including problem solving and executive function. She was cooperative and verbally engaged in [employer] conversation.  Per parts of the  SLUMS assessment, pt demonstrated inconsistent, functional recall of Paragraph information read to her but inability to Recall 5/5 words post delay. Verbal engagement and general problem ID and problem-solving of Basic situations was functional, but pt was unable to give further detail of insight into situations/impact further. Other areas on the SLUMS revealed relative weaknesses; this included verbal fluency, delayed recall, digit repetition, and basic math. Pt was Oriented to self and place(hospital) but not to Time/Year/Month/recent Holidays just passed.  Patient lives alone w/ support from the [facility]. Although she was able to ID problem-solving (I'd call my Doctor about that) of uncertainty about her Medications, general support and Supervision of Medications in the Home would be strongly recommended for Safety reasons; Supervision w/ any Finances is recommended also.   No Motor Speech nor gross expressive/receptive language deficits noted during informal assessment -- suspect impact of Cognitive impairment on particular language tasks.   In setting of pt's Baseline Cognitive decline, Memory Impairment per Neurology note 2024, and mild HOH (as noted in room), suspect impact on pt's communication abilities. F/u for Cognitive intervention could be beneficial w/ Cognitive ADLs in her Home environment, especially to address Safety issues in the Home environment.  TOC stated pt's [facility] could f/u w/ OT Services to address Cognitive needs/goals.    No further Acute ST services indicated currently. Pt is d/t have [facility] Program services in the Home per [facility]; recommend Supervision in the home post D/C. Pt in agreement w/ plan. MD/NSG/Team updated via secure chat.     SLP Assessment  SLP Recommendation/Assessment: All further Speech Language Pathology needs can be addressed in the next venue of care (vs OT services for Cognitive Therapy through [facility] program (per [facility])) SLP Visit Diagnosis:  Cognitive communication deficit (R41.841) (Baseline of Memory Impairment per chart)     Assistance Recommended at Discharge  Frequent or constant Supervision/Assistance (w/ ADLs especially medications)  Functional Status Assessment Patient has had a recent decline in their functional status and/or demonstrates limited ability to make significant improvements in function in a reasonable and predictable amount of time  Frequency and Duration  (n/a)   (n/a)      SLP Evaluation Cognition  Overall Cognitive Status: History of cognitive impairments - at baseline Arousal/Alertness: Awake/alert Orientation Level: Oriented to person;Oriented to place;Disoriented to time;Disoriented to situation Year:  (1900 and something?) Month: July Attention: Focused Focused Attention: Impaired Focused Attention Impairment: Verbal complex Memory: Impaired Memory Impairment: Decreased recall of new information Awareness: Impaired Awareness Impairment: Anticipatory impairment Problem Solving: Impaired Problem Solving Impairment: Verbal complex Executive Function: Reasoning (Fair) Safety/Judgment: Impaired Comments: Fair w/ certain problems in ADLs       Comprehension  Auditory Comprehension Overall Auditory Comprehension: Appears within functional limits for tasks assessed Yes/No Questions: Within Functional Limits (basic) Commands: Within Functional Limits (1-2 step) Conversation: Simple Visual Recognition/Discrimination Discrimination: Not tested Reading Comprehension Reading Status: Not tested    Expression Expression Primary Mode of Expression: Verbal Verbal Expression Overall Verbal Expression: Appears within functional limits for tasks assessed Initiation: No impairment Automatic Speech:  (wfl) Level of Generative/Spontaneous Verbalization: Sentence Naming: No impairment Pragmatics: No impairment Written Expression Dominant Hand: Right Written Expression: Not tested   Oral /  Motor  Oral Motor/Sensory Function Overall Oral Motor/Sensory Function: Within functional limits Motor Speech Overall Motor Speech: Appears within functional limits for tasks assessed Respiration: Within functional limits Phonation: Normal Resonance: Within functional limits Articulation: Within functional limitis Intelligibility: Intelligible Motor Planning: Within functional limits Motor Speech Errors: Not applicable               Comer Portugal, MS, CCC-SLP Speech Language Pathologist Rehab Services; [facility] [phone] (ascom) Regnald Bowens 09/17/2024, 4:27 PM

## 2024-09-17 NOTE — Progress Notes (Signed)
 [facility] Progress Note  Patient: Lisa Thompson    FMW:[id number]  DOA: 09/16/2024     Date of Service: the patient was seen and examined on 09/17/2024  Chief Complaint  Patient presents with   Dizziness   Brief hospital course: Lisa Thompson is a pleasant 79 y.o. female with medical history significant for  HTN, HLD, Arthritis, pre-diabetes, motion sickness  and prior CVA who was brought in from home with complaints of dizziness upon waking up this morning.  Patient denies any nausea vomiting.  Stroke screen was negative with EMS.  She stated that the room is spinning otherwise no other symptoms.  Patient denies any fever, upper respiratory symptoms, cough, shortness of breath, palpitations, chest pain. No speech changes no numbness or weakness in the arms or legs that she has noticed. No rash no change in hearing.    ED Course: Upon arrival to the ED, patient is found to be hypertensive at 187/62, heart rate 52, respiration 24.  Creatinine was 1.06, EKG shows sinus bradycardia, no evidence of ischemia MRI of the brain was done which showed no obvious stroke.  MRI of the brain shows no large vessel occlusion.  Neurologist evaluated the patient and advised that this could be peripheral vertigo versus TIA.  Hospitalist service was consulted for evaluation for admission.     Assessment and Plan:  # Vertigo, BPV left ear  With peripheral nystagmus - Benign positional paroxysmal vertigo BPPV - Neurologist input appreciated - Continue meclizine  - Continue neurochecks, tele monitoring, TIA stroke workup - PT/OT/ST ENT was consulted, recommended Hallpike as an outpatient in the office   # Sinus bradycardia without any chest pain - She takes bisoprolol  daily along with amlodipine  for hypertension. - This could be related to bradycardia but she has a nystagmus I do not believe this dizziness is related to this.   #  HTN - Continue bisoprolol  and amlodipine  at home dose. - She may  need to discuss with primary care about those medications. - I will place hydralazine  for systolic blood pressure more than 160 - But in any case I do not believe this is related to bisoprolol .  She has an nystagmus.   # History of stroke  - Supportive care     Body mass index is 25.63 kg/m.  Interventions:  Diet: Regular diet DVT Prophylaxis: Subcutaneous Lovenox    Advance goals of care discussion: Full code  Family Communication: family was present at bedside, at the time of interview.  The pt provided permission to discuss medical plan with the family. Opportunity was given to ask question and all questions were answered satisfactorily.   Disposition:  Pt is from home, admitted with benign peripheral positional vertigo, left ear, still has vertigo, which precludes a safe discharge. Discharge to home, when stable, most likely tomorrow a.m.  Subjective: No significant events overnight.  Patient still feels vertigo off-and-on and feels more vertigo when she moves her neck towards the left side.  Patient negative for Hallpike's pipe by physical therapy, patient could not tolerate so cleared by ENT discharge and follow-up with ENT as an outpatient  Physical Exam: General: NAD, lying comfortably Appear in no distress, affect appropriate Eyes: PERRLA ENT: Oral Mucosa Clear, moist  Neck: no JVD,  Cardiovascular: S1 and S2 Present, no Murmur,  Respiratory: good respiratory effort, Bilateral Air entry equal and Decreased, no Crackles, no wheezes Abdomen: Bowel Sound present, Soft and no tenderness,  Skin: no rashes Extremities: no Pedal edema, no calf tenderness  Neurologic: without any new focal findings Gait not checked due to patient safety concerns  Vitals:   09/17/24 0817 09/17/24 1205 09/17/24 1627 09/17/24 1733  BP: (!) 157/65 (!) 120/55 (!) 162/62 (!) 148/63  Pulse: (!) 51 64 (!) 56 60  Resp: 18 17 18    Temp: 98 F (36.7 C) 98 F (36.7 C) (!) 97.5 F (36.4 C)    TempSrc:      SpO2: 100% 100% 98% 98%  Weight:      Height:        Intake/Output Summary (Last 24 hours) at 09/17/2024 1857 Last data filed at 09/17/2024 0600 Gross per 24 hour  Intake 342.36 ml  Output --  Net 342.36 ml   Filed Weights   09/16/24 0918  Weight: 69.9 kg    Data Reviewed: I have personally reviewed and interpreted daily labs, tele strips, imagings as discussed above. I reviewed all nursing notes, pharmacy notes, vitals, pertinent old records I have discussed plan of care as described above with RN and patient/family.  CBC: Recent Labs  Lab 09/16/24 1031  WBC 7.5  NEUTROABS 6.5  HGB 13.5  HCT 41.4  MCV 94.1  PLT 223   Basic Metabolic Panel: Recent Labs  Lab 09/16/24 0932 09/16/24 1031 09/17/24 1059  NA 138  --  140  K 4.4  --  3.7  CL 99  --  100  CO2 23  --  28  GLUCOSE 191*  --  145*  BUN 17  --  17  CREATININE 1.06* 0.88 1.22*  CALCIUM 9.6  --  9.1  MG  --   --  2.2  PHOS  --   --  2.6    Studies: ECHOCARDIOGRAM COMPLETE Result Date: 09/17/2024    ECHOCARDIOGRAM REPORT   Patient Name:   Lisa Thompson Date of Exam: 09/17/2024 Medical Rec #:  [id number]        Height:       65.0 in Accession #:    [id number]       Weight:       154.0 lb Date of Birth:  1946/06/21        BSA:          1.770 m Patient Age:    78 years         BP:           176/63 mmHg Patient Gender: F                HR:           49 bpm. Exam Location:  [facility] Procedure: 2D Echo, Saline Contrast Bubble Study, Cardiac Doppler, Color Doppler            and Strain Analysis (Both Spectral and Color Flow Doppler were            utilized during procedure). Indications:     TIA G45.9  History:         Patient has prior history of Echocardiogram examinations, most                  recent 10/22/2021. Risk Factors:Hypertension.  Sonographer:     Christopher Furnace Referring Phys:  [id number] [facility] PAUDEL Diagnosing Phys: Cara JONETTA Lovelace MD  Sonographer Comments: Global longitudinal strain was attempted.  IMPRESSIONS  1. Left ventricular ejection fraction, by estimation, is 70 to 75%. The left ventricle has hyperdynamic function. The left ventricle has no regional wall motion abnormalities. Left ventricular diastolic parameters are  consistent with Grade I diastolic dysfunction (impaired relaxation). The average left ventricular global longitudinal strain is 16.9 %. The global longitudinal strain is normal.  2. Right ventricular systolic function is normal. The right ventricular size is normal.  3. The mitral valve is normal in structure. No evidence of mitral valve regurgitation.  4. The aortic valve is normal in structure. Aortic valve regurgitation is trivial. Aortic valve sclerosis is present, with no evidence of aortic valve stenosis. FINDINGS  Left Ventricle: Left ventricular ejection fraction, by estimation, is 70 to 75%. The left ventricle has hyperdynamic function. The left ventricle has no regional wall motion abnormalities. The average left ventricular global longitudinal strain is 16.9 %. Strain was performed and the global longitudinal strain is normal. The left ventricular internal cavity size was normal in size. There is no left ventricular hypertrophy. Left ventricular diastolic parameters are consistent with Grade I diastolic dysfunction (impaired relaxation). Right Ventricle: The right ventricular size is normal. No increase in right ventricular wall thickness. Right ventricular systolic function is normal. Left Atrium: Left atrial size was normal in size. Right Atrium: Right atrial size was normal in size. Pericardium: There is no evidence of pericardial effusion. Mitral Valve: The mitral valve is normal in structure. No evidence of mitral valve regurgitation. MV peak gradient, 5.2 mmHg. The mean mitral valve gradient is 2.0 mmHg. Tricuspid Valve: The tricuspid valve is normal in structure. Tricuspid valve regurgitation is trivial. Aortic Valve: The aortic valve is normal in structure. Aortic valve  regurgitation is trivial. Aortic valve sclerosis is present, with no evidence of aortic valve stenosis. Aortic valve mean gradient measures 2.0 mmHg. Aortic valve peak gradient measures 3.8 mmHg. Aortic valve area, by VTI measures 3.49 cm. Pulmonic Valve: The pulmonic valve was not well visualized. Pulmonic valve regurgitation is not visualized. Aorta: The ascending aorta was not well visualized. IAS/Shunts: No atrial level shunt detected by color flow Doppler. Agitated saline contrast was given intravenously to evaluate for intracardiac shunting. Additional Comments: 3D was performed not requiring image post processing on an independent workstation and was indeterminate.  LEFT VENTRICLE PLAX 2D LVIDd:         4.00 cm   Diastology LVIDs:         2.30 cm   LV e' medial:    5.44 cm/s LV PW:         0.90 cm   LV E/e' medial:  14.2 LV IVS:        1.00 cm   LV e' lateral:   4.79 cm/s LVOT diam:     2.00 cm   LV E/e' lateral: 16.1 LV SV:         91 LV SV Index:   52        2D Longitudinal Strain LVOT Area:     3.14 cm  2D Strain GLS (A4C):   18.8 % LV IVRT:       119 msec  2D Strain GLS (A3C):   12.5 %                          2D Strain GLS (A2C):   19.5 %                          2D Strain GLS Avg:     16.9 % RIGHT VENTRICLE RV Basal diam:  2.80 cm     PULMONARY VEINS RV Mid diam:  2.30 cm     Diastolic Velocity: 35.20 cm/s RV S prime:     11.90 cm/s  S/D Velocity:       1.70                             Systolic Velocity:  60.30 cm/s LEFT ATRIUM           Index        RIGHT ATRIUM           Index LA diam:      2.80 cm 1.58 cm/m   RA Area:     15.90 cm LA Vol (A2C): 33.6 ml 18.98 ml/m  RA Volume:   41.00 ml  23.16 ml/m LA Vol (A4C): 17.9 ml 10.11 ml/m  AORTIC VALVE AV Area (Vmax):    3.43 cm AV Area (Vmean):   3.40 cm AV Area (VTI):     3.49 cm AV Vmax:           97.90 cm/s AV Vmean:          65.800 cm/s AV VTI:            0.262 m AV Peak Grad:      3.8 mmHg AV Mean Grad:      2.0 mmHg LVOT Vmax:          107.00 cm/s LVOT Vmean:        71.300 cm/s LVOT VTI:          0.291 m LVOT/AV VTI ratio: 1.11  AORTA Ao Root diam: 3.10 cm MITRAL VALVE               TRICUSPID VALVE MV Area (PHT): 2.24 cm    TR Peak grad:   7.8 mmHg MV Area VTI:   2.18 cm    TR Vmax:        140.00 cm/s MV Peak grad:  5.2 mmHg MV Mean grad:  2.0 mmHg    SHUNTS MV Vmax:       1.14 m/s    Systemic VTI:  0.29 m MV Vmean:      61.9 cm/s   Systemic Diam: 2.00 cm MV Decel Time: 338 msec MV E velocity: 77.00 cm/s MV A velocity: 99.00 cm/s MV E/A ratio:  0.78 Dwayne D Callwood MD Electronically signed by Cara JONETTA Lovelace MD Signature Date/Time: 09/17/2024/4:51:37 PM    Final     Scheduled Meds:  amLODipine   5 mg Oral Daily   bisoprolol   5 mg Oral Daily   clopidogrel   75 mg Oral Daily   enoxaparin  (LOVENOX ) injection  40 mg Subcutaneous Daily   insulin  aspart  0-15 Units Subcutaneous TID WC   meclizine   12.5 mg Oral Once   Continuous Infusions: PRN Meds: acetaminophen  **OR** acetaminophen  (TYLENOL ) oral liquid 160 mg/5 mL **OR** acetaminophen , hydrALAZINE , meclizine , senna-docusate  Time spent: 35 minutes  Author: ELVAN SOR. MD [facility] 09/17/2024 6:57 PM  To reach On-call, see care teams to locate the attending and reach out to them via [email or web address]. If 7PM-7AM, please contact night-coverage If you still have difficulty reaching the attending provider, please page the [facility] (Director on Call) for [facility] on amion for assistance.

## 2024-09-17 NOTE — Evaluation (Signed)
 Physical Therapy Evaluation Patient Details Name: Lisa Thompson MRN: [id number] DOB: 02/17/1946 Today's Date: 09/17/2024  History of Present Illness  79 y.o. female with medical history significant for  HTN, HLD, Arthritis, pre-diabetes, motion sickness  and prior CVA who was brought in from home with complaints of dizziness upon waking up this morning.  Clinical Impression  Patient admitted with the above. PTA, patient lives alone in a 3rd floor apartment with elevator and was modI with cane. Patient presents with dizziness, impaired balance, weakness, and decreased activity tolerance. Required CGA for bed mobility and sit to stand. Able to transfer to recliner with RW and CGA+2 for safety. Dizziness constant despite position changes and rating 8/10. Demonstrates L beating nystagmus that is non-extinguishing. Likely not BPPV based on presentation. Patient will benefit from skilled PT services during acute stay to address listed deficits. Patient will benefit from ongoing therapy at discharge to maximize functional independence and safety.         If plan is discharge home, recommend the following: A little help with walking and/or transfers;A little help with bathing/dressing/bathroom;Assistance with cooking/housework;Assist for transportation   Can travel by private vehicle        Equipment Recommendations Rolling Ginnifer Creelman (2 wheels)  Recommendations for Other Services       Functional Status Assessment Patient has had a recent decline in their functional status and demonstrates the ability to make significant improvements in function in a reasonable and predictable amount of time.     Precautions / Restrictions Precautions Precautions: Fall Recall of Precautions/Restrictions: Intact Restrictions Weight Bearing Restrictions Per Provider Order: No      Mobility  Bed Mobility Overal bed mobility: Needs Assistance Bed Mobility: Supine to Sit     Supine to sit: Supervision,  Contact guard          Transfers Overall transfer level: Needs assistance Equipment used: Rolling Rendi Mapel (2 wheels) Transfers: Sit to/from Stand, Bed to chair/wheelchair/BSC Sit to Stand: Contact guard assist, +2 safety/equipment   Step pivot transfers: Contact guard assist, +2 safety/equipment       General transfer comment: +2 for safety    Ambulation/Gait                  Stairs            Wheelchair Mobility     Tilt Bed    Modified Rankin (Stroke Patients Only)       Balance Overall balance assessment: Needs assistance Sitting-balance support: Single extremity supported, Bilateral upper extremity supported, Feet supported Sitting balance-Leahy Scale: Fair     Standing balance support: Reliant on assistive device for balance, Bilateral upper extremity supported Standing balance-Leahy Scale: Poor                               Pertinent Vitals/Pain Pain Assessment Pain Assessment: No/denies pain    Home Living Family/patient expects to be discharged to:: Private residence Living Arrangements: Alone   Type of Home: Apartment Home Access: Elevator       Home Layout: One level Home Equipment: Cane - single point      Prior Function Prior Level of Function : Independent/Modified Independent;Driving             Mobility Comments: SPC ADLs Comments: indep     Extremity/Trunk Assessment   Upper Extremity Assessment Upper Extremity Assessment: Defer to OT evaluation    Lower Extremity Assessment Lower Extremity Assessment: Overall [facility]  for tasks assessed    Cervical / Trunk Assessment Cervical / Trunk Assessment: Normal  Communication   Communication Communication: No apparent difficulties    Cognition Arousal: Alert Behavior During Therapy: WFL for tasks assessed/performed   PT - Cognitive impairments: No apparent impairments                         Following commands: Intact       Cueing  Cueing Techniques: Verbal cues     General Comments      Exercises     Assessment/Plan    PT Assessment Patient needs continued PT services  PT Problem List Decreased strength;Decreased activity tolerance;Decreased balance;Decreased mobility;Decreased knowledge of use of DME;Decreased knowledge of precautions;Decreased safety awareness       PT Treatment Interventions DME instruction;Gait training;Functional mobility training;Therapeutic activities;Balance training;Therapeutic exercise;Neuromuscular re-education;Patient/family education    PT Goals (Current goals can be found in the Care Plan section)  Acute Rehab PT Goals Patient Stated Goal: to reduce dizziness PT Goal Formulation: With patient Time For Goal Achievement: 10/01/24 Potential to Achieve Goals: Good    Frequency Min 2X/week     Co-evaluation PT/OT/SLP Co-Evaluation/Treatment: Yes Reason for Co-Treatment: Complexity of the patient's impairments (multi-system involvement);For patient/therapist safety;To address functional/ADL transfers PT goals addressed during session: Mobility/safety with mobility;Balance;Proper use of DME OT goals addressed during session: ADL's and self-care       AM-PAC PT 6 Clicks Mobility  Outcome Measure Help needed turning from your back to your side while in a flat bed without using bedrails?: A Little Help needed moving from lying on your back to sitting on the side of a flat bed without using bedrails?: A Little Help needed moving to and from a bed to a chair (including a wheelchair)?: A Little Help needed standing up from a chair using your arms (e.g., wheelchair or bedside chair)?: A Little Help needed to walk in hospital room?: A Little Help needed climbing 3-5 steps with a railing? : A Little 6 Click Score: 18    End of Session   Activity Tolerance: Patient tolerated treatment well Patient left: in chair;with call bell/phone within reach;with chair alarm set Nurse  Communication: Mobility status PT Visit Diagnosis: Unsteadiness on feet (R26.81);Muscle weakness (generalized) (M62.81);Dizziness and giddiness (R42)    Time: 9057-8994 PT Time Calculation (min) (ACUTE ONLY): 23 min   Charges:   PT Evaluation $PT Eval Moderate Complexity: 1 Mod   PT General Charges $$ ACUTE PT VISIT: 1 Visit         Maryanne Finder, PT, DPT Physical Therapist - [facility]  [facility]   Cerenity Goshorn A Preciosa Bundrick 09/17/2024, 12:19 PM

## 2024-09-17 NOTE — Evaluation (Signed)
 Occupational Therapy Evaluation Patient Details Name: Lisa Thompson MRN: [id number] DOB: [birth year] Today's Date: 09/17/2024   History of Present Illness   79 y.o. female with medical history significant for  HTN, HLD, Arthritis, pre-diabetes, motion sickness  and prior CVA who was brought in from home with complaints of dizziness upon waking up this morning.     Clinical Impressions Pt was seen for OT evaluation this date. Prior to hospital admission, pt reports being independent, using SPC for mobility, and living by herself in a 3rd fl apartment with elevator. Pt reports having a boyfriend who lives on the 1st fl of the apartment complex and 5 brothers who are all able to assist as needed. Pt presents with deficits in orientation, awareness of deficits, and balance limiting their ability to perform ADL management at baseline level. Pt currently requires SBA-CGA for ADL transfers with RW, unsteady. L beating nystagmus constant during session. Pt endorsing dizziness worse upon sitting and standing, easing slightly once seated in recliner. Pt would benefit from skilled OT services to address noted impairments and functional limitations (see below for any additional details) in order to maximize safety and independence while minimizing future risk of falls, injury, and readmission. Anticipate the need for follow up OT services upon acute hospital DC.=    If plan is discharge home, recommend the following:   A little help with walking and/or transfers;A little help with bathing/dressing/bathroom;Assistance with cooking/housework;Assist for transportation;Help with stairs or ramp for entrance     Functional Status Assessment   Patient has had a recent decline in their functional status and demonstrates the ability to make significant improvements in function in a reasonable and predictable amount of time.     Equipment Recommendations   Other (comment) (RW)     Recommendations for  Other Services         Precautions/Restrictions   Precautions Precautions: Fall Recall of Precautions/Restrictions: Intact Restrictions Weight Bearing Restrictions Per Provider Order: No     Mobility Bed Mobility Overal bed mobility: Needs Assistance Bed Mobility: Supine to Sit     Supine to sit: Supervision, Contact guard     General bed mobility comments: increased time/effort    Transfers Overall transfer level: Needs assistance Equipment used: Rolling walker (2 wheels) Transfers: Sit to/from Stand Sit to Stand: Contact guard assist           General transfer comment: +2 safety provided      Balance Overall balance assessment: Needs assistance Sitting-balance support: Single extremity supported, Bilateral upper extremity supported, Feet supported Sitting balance-Leahy Scale: Fair     Standing balance support: Reliant on assistive device for balance, Bilateral upper extremity supported Standing balance-Leahy Scale: Poor                             ADL either performed or assessed with clinical judgement   ADL                                         General ADL Comments: Pt grossly requiring SBA-CGA for ADL and ADL mobility with RW. Dizziness impairing balance     Vision Baseline Vision/History: 1 Wears glasses Ability to See in Adequate Light: 0 Adequate Patient Visual Report: Other (comment) Vision Assessment?: Yes Eye Alignment: Within Functional Limits Additional Comments: L beating nystagmus constant, not dependent on positioning  Perception         Praxis         Pertinent Vitals/Pain Pain Assessment Pain Assessment: No/denies pain     Extremity/Trunk Assessment Upper Extremity Assessment Upper Extremity Assessment: Overall WFL for tasks assessed   Lower Extremity Assessment Lower Extremity Assessment: Defer to PT evaluation   Cervical / Trunk Assessment Cervical / Trunk Assessment: Normal    Communication Communication Communication: No apparent difficulties   Cognition Arousal: Alert Behavior During Therapy: WFL for tasks assessed/performed Cognition: No apparent impairments                               Following commands: Intact       Cueing  General Comments   Cueing Techniques: Verbal cues      Exercises     Shoulder Instructions      Home Living Family/patient expects to be discharged to:: Private residence Living Arrangements: Alone   Type of Home: Apartment Home Access: Elevator     Home Layout: One level     Bathroom Shower/Tub: Chief Strategy Officer: Standard     Home Equipment: Cane - single point          Prior Functioning/Environment Prior Level of Function : Independent/Modified Independent;Driving             Mobility Comments: SPC ADLs Comments: indep    OT Problem List: Decreased cognition;Decreased safety awareness;Decreased activity tolerance;Impaired balance (sitting and/or standing);Decreased knowledge of use of DME or AE   OT Treatment/Interventions: Self-care/ADL training;Therapeutic exercise;Therapeutic activities;DME and/or AE instruction;Patient/family education;Balance training;Energy conservation;Visual/perceptual remediation/compensation;Cognitive remediation/compensation;Neuromuscular education      OT Goals(Current goals can be found in the care plan section)   Acute Rehab OT Goals Patient Stated Goal: feel better OT Goal Formulation: With patient Time For Goal Achievement: 10/01/24 Potential to Achieve Goals: Good ADL Goals Pt Will Perform Lower Body Dressing: with modified independence;sit to/from stand;sitting/lateral leans Pt Will Transfer to Toilet: with modified independence;ambulating (LRAD) Pt Will Perform Toileting - Clothing Manipulation and hygiene: with modified independence;sitting/lateral leans;sit to/from stand Additional ADL Goal #1: Pt will verbalize plan to  implement at least 1 learned compensatory strategy for dizziness to maximize safety wiht ADL/mobility and minimize falls risk.   OT Frequency:  Min 2X/week    Co-evaluation PT/OT/SLP Co-Evaluation/Treatment: Yes Reason for Co-Treatment: Complexity of the patient's impairments (multi-system involvement);For patient/therapist safety;To address functional/ADL transfers PT goals addressed during session: Mobility/safety with mobility;Balance;Proper use of DME OT goals addressed during session: ADL's and self-care      AM-PAC OT 6 Clicks Daily Activity     Outcome Measure Help from another person eating meals?: None Help from another person taking care of personal grooming?: A Little Help from another person toileting, which includes using toliet, bedpan, or urinal?: A Little Help from another person bathing (including washing, rinsing, drying)?: A Little Help from another person to put on and taking off regular upper body clothing?: None Help from another person to put on and taking off regular lower body clothing?: A Little 6 Click Score: 20   End of Session    Activity Tolerance: Patient tolerated treatment well;Treatment limited secondary to medical complications (Comment) (dizziness) Patient left: in chair;with call bell/phone within reach;with chair alarm set  OT Visit Diagnosis: Other abnormalities of gait and mobility (R26.89)                Time: 9052-8994 OT Time Calculation (  min): 18 min Charges:  OT General Charges $OT Visit: 1 Visit OT Evaluation $OT Eval Low Complexity: 1 Low  Warren SAUNDERS., MPH, MS, OTR/L ascom [phone] 09/17/2024, 11:46 AM

## 2024-09-17 NOTE — TOC Initial Note (Signed)
 Transition of Care [facility]) - Initial/Assessment Note    Patient Details  Name: Lisa Thompson MRN: [id number] Date of Birth: [birth year]  Transition of Care [facility]) CM/SW Contact:    [name], RN Phone Number: 09/17/2024, 3:25 PM  Clinical Narrative:                 This patient is a participant of [facility], she lives alone in an apartment at [place].  She is independent in ADL's, she has dementia.  [facility] and [facility] provides all her transportation.  CM has been in contact with the CM, Stacy at [facility] and provided an update.  [facility] will provide her transportation at discharge.    Expected Discharge Plan: Home/Self Care Barriers to Discharge: Continued Medical Work up   Patient Goals and CMS Choice            Expected Discharge Plan and Services   Discharge Planning Services: CM Consult   Living arrangements for the past 2 months: Apartment                 DME Arranged: N/A         HH Arranged: NA          Prior Living Arrangements/Services Living arrangements for the past 2 months: Apartment Lives with:: Self Patient language and need for interpreter reviewed:: Yes Do you feel safe going back to the place where you live?: Yes      Need for Family Participation in Patient Care: Yes (Comment) Care giver support system in place?: Yes (comment)   Criminal Activity/Legal Involvement Pertinent to Current Situation/Hospitalization: No - Comment as needed  Activities of Daily Living   ADL Screening (condition at time of admission) Independently performs ADLs?: Yes (appropriate for developmental age) Is the patient deaf or have difficulty hearing?: Yes Does the patient have difficulty seeing, even when wearing glasses/contacts?: Yes Does the patient have difficulty concentrating, remembering, or making decisions?: Yes  Permission Sought/Granted         Permission granted to share info w AGENCY: [facility]        Emotional  Assessment Appearance:: Appears stated age       Alcohol / Substance Use: Not Applicable Psych Involvement: No (comment)  Admission diagnosis:  Vertigo [R42] Nystagmus [H55.00] Vertigo, benign positional [H81.10] Hypertension, unspecified type [I10] Patient Active Problem List   Diagnosis Date Noted   Vertigo, benign positional 09/16/2024   Forgetfulness 12/21/2022   Left foot pain 12/29/2020   Low back pain 06/22/2020   Encounter for screening mammogram for malignant neoplasm of breast 03/16/2020   Primary insomnia 09/30/2019   Other fatigue 09/30/2019   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 [place] [address] Phone: [phone] Fax: [phone]     Social Drivers of Health (SDOH) Social History: SDOH Screenings   Food Insecurity: No Food Insecurity (09/16/2024)  Housing: Low Risk (09/16/2024)  Transportation Needs: No Transportation Needs (09/16/2024)  Utilities: Not At Risk (09/16/2024)  Alcohol Screen: Low Risk (02/03/2022)  Depression (PHQ2-9): Low Risk (03/22/2023)  Social Connections: Moderately Integrated (09/16/2024)  Tobacco Use: Low Risk (09/16/2024)   SDOH Interventions:     Readmission Risk Interventions     No data to display

## 2024-09-17 NOTE — Progress Notes (Signed)
 NIHSS discontinues as per MD order

## 2024-09-18 DIAGNOSIS — I10 Essential (primary) hypertension: Secondary | ICD-10-CM

## 2024-09-18 DIAGNOSIS — N1831 Chronic kidney disease, stage 3a: Secondary | ICD-10-CM

## 2024-09-18 DIAGNOSIS — H8113 Benign paroxysmal vertigo, bilateral: Secondary | ICD-10-CM

## 2024-09-18 LAB — BASIC METABOLIC PANEL WITH GFR
Anion gap: 12 (ref 5–15)
Anion gap: 11 (ref 5–15)
BUN: 35 mg/dL — ABNORMAL HIGH (ref 8–23)
BUN: 32 mg/dL — ABNORMAL HIGH (ref 8–23)
CO2: 27 mmol/L (ref 22–32)
CO2: 28 mmol/L (ref 22–32)
Calcium: 9 mg/dL (ref 8.9–10.3)
Calcium: 9.6 mg/dL (ref 8.9–10.3)
Chloride: 105 mmol/L (ref 98–111)
Chloride: 104 mmol/L (ref 98–111)
Creatinine, Ser: 1.59 mg/dL — ABNORMAL HIGH (ref 0.44–1.00)
Creatinine, Ser: 1.41 mg/dL — ABNORMAL HIGH (ref 0.44–1.00)
GFR, Estimated: 33 mL/min — ABNORMAL LOW
GFR, Estimated: 38 mL/min — ABNORMAL LOW
Glucose, Bld: 110 mg/dL — ABNORMAL HIGH (ref 70–99)
Glucose, Bld: 99 mg/dL (ref 70–99)
Potassium: 3.9 mmol/L (ref 3.5–5.1)
Potassium: 4.3 mmol/L (ref 3.5–5.1)
Sodium: 143 mmol/L (ref 135–145)
Sodium: 143 mmol/L (ref 135–145)

## 2024-09-18 LAB — GLUCOSE, CAPILLARY
Glucose-Capillary: 104 mg/dL — ABNORMAL HIGH (ref 70–99)
Glucose-Capillary: 162 mg/dL — ABNORMAL HIGH (ref 70–99)
Glucose-Capillary: 85 mg/dL (ref 70–99)
Glucose-Capillary: 132 mg/dL — ABNORMAL HIGH (ref 70–99)

## 2024-09-18 LAB — CBC
HCT: 40.5 % (ref 36.0–46.0)
Hemoglobin: 13.2 g/dL (ref 12.0–15.0)
MCH: 31 pg (ref 26.0–34.0)
MCHC: 32.6 g/dL (ref 30.0–36.0)
MCV: 95.1 fL (ref 80.0–100.0)
Platelets: 224 K/uL (ref 150–400)
RBC: 4.26 MIL/uL (ref 3.87–5.11)
RDW: 14 % (ref 11.5–15.5)
WBC: 6.4 K/uL (ref 4.0–10.5)
nRBC: 0 % (ref 0.0–0.2)

## 2024-09-18 MED ORDER — AMLODIPINE BESYLATE 5 MG PO TABS
5.0000 mg | ORAL_TABLET | Freq: Once | ORAL | Status: AC
  Administered 2024-09-18: 5 mg via ORAL
  Filled 2024-09-18: qty 1

## 2024-09-18 MED ORDER — AMLODIPINE BESYLATE 10 MG PO TABS
10.0000 mg | ORAL_TABLET | Freq: Every day | ORAL | Status: DC
  Administered 2024-09-19: 10 mg via ORAL
  Filled 2024-09-18: qty 1

## 2024-09-18 MED ORDER — LACTATED RINGERS IV SOLN: INTRAVENOUS | Status: DC

## 2024-09-18 MED ORDER — ENOXAPARIN SODIUM 40 MG/0.4ML IJ SOSY
40.0000 mg | PREFILLED_SYRINGE | Freq: Every day | INTRAMUSCULAR | Status: DC
  Administered 2024-09-19: 40 mg via SUBCUTANEOUS
  Filled 2024-09-18: qty 0.4

## 2024-09-18 MED ORDER — ENOXAPARIN SODIUM 30 MG/0.3ML IJ SOSY
30.0000 mg | PREFILLED_SYRINGE | Freq: Every day | INTRAMUSCULAR | Status: DC
  Administered 2024-09-18: 30 mg via SUBCUTANEOUS
  Filled 2024-09-18: qty 0.3

## 2024-09-18 NOTE — Progress Notes (Signed)
 Per Dr Laurita, dc NIH order

## 2024-09-18 NOTE — TOC Progression Note (Signed)
 Transition of Care [facility]) - Progression Note    Patient Details  Name: Lisa Thompson MRN: [id number] Date of Birth: 06/11/1946  [facility]) CM/SW Contact  [name], RN Phone Number: 09/18/2024, 11:06 AM  Clinical Narrative:     Spoke with [facility] CM, Stacy and provided update.  Patient is currently working with PT walking with a walker in the hallway CGA and assist with navigation, patient normally walks with a cane.  Spoke with provider and patient's has some worsening renal function so he has ordered IVF and will recheck her BMP after fluids.  If kidney function better he may discharge.  She does live alone.  [facility] is going to meet with the team about sending her to respite at DC.  Therapy does not feel that she is safe to go home alone at this time.   Expected Discharge Plan: Home/Self Care Barriers to Discharge: Continued Medical Work up               Expected Discharge Plan and Services   Discharge Planning Services: CM Consult   Living arrangements for the past 2 months: Apartment                 DME Arranged: N/A         HH Arranged: NA           Social Drivers of Health (SDOH) Interventions SDOH Screenings   Food Insecurity: No Food Insecurity (09/16/2024)  Housing: Low Risk (09/16/2024)  Transportation Needs: No Transportation Needs (09/16/2024)  Utilities: Not At Risk (09/16/2024)  Alcohol Screen: Low Risk (02/03/2022)  Depression (PHQ2-9): Low Risk (03/22/2023)  Social Connections: Moderately Integrated (09/16/2024)  Tobacco Use: Low Risk (09/16/2024)    Readmission Risk Interventions     No data to display

## 2024-09-18 NOTE — Progress Notes (Signed)
 Physical Therapy Treatment Patient Details Name: Lisa Thompson MRN: [id number] DOB: 1946/06/27 Today's Date: 09/18/2024   History of Present Illness 79 y.o. female with medical history significant for  HTN, HLD, Arthritis, pre-diabetes, motion sickness  and prior CVA who was brought in from home with complaints of dizziness upon waking up this morning.    PT Comments  Pt seen for PT tx with pt agreeable, pleasantly confused, does not recall BPPV testing from yesterday. Pt with ongoing L beating nystagmus, pt with c/o eyes moving. Pt requires min assist for transfers 2/2 posterior lean, cuing re: need for increased anterior weight shifting. Pt is able to ambulate 1 lap around nurses station with RW & CGA, veering L at times. Pt making steady progress with mobility. Pt with cognitive deficits that case manager reports are baseline; PT expressed concerns about pt d/c home alone 2/2 cognitive deficits. Will continue to follow pt acutely to progress mobility & reduce fall risk as able.   If plan is discharge home, recommend the following: A little help with walking and/or transfers;A little help with bathing/dressing/bathroom;Assistance with cooking/housework;Assist for transportation;Supervision due to cognitive status;Direct supervision/assist for financial management   Can travel by private vehicle        Equipment Recommendations  Rolling walker (2 wheels)    Recommendations for Other Services       Precautions / Restrictions Precautions Precautions: Fall Restrictions Weight Bearing Restrictions Per Provider Order: No     Mobility  Bed Mobility Overal bed mobility: Needs Assistance Bed Mobility: Supine to Sit     Supine to sit: Supervision, HOB elevated, Used rails (exit R side of bed)          Transfers Overall transfer level: Needs assistance Equipment used: Rolling walker (2 wheels) Transfers: Sit to/from Stand Sit to Stand: Min assist (2/2 posterior bias)            General transfer comment: cuing re: hand placement to push to standing    Ambulation/Gait Ambulation/Gait assistance: Contact guard assist Gait Distance (Feet): 165 Feet Assistive device: Rolling walker (2 wheels) Gait Pattern/deviations: Decreased step length - right, Decreased step length - left, Decreased stride length Gait velocity: decreased     General Gait Details: slight L lateral sway/veering to L at times   Stairs             Wheelchair Mobility     Tilt Bed    Modified Rankin (Stroke Patients Only)       Balance Overall balance assessment: Needs assistance Sitting-balance support: Single extremity supported, Bilateral upper extremity supported, Feet supported Sitting balance-Leahy Scale: Fair     Standing balance support: During functional activity, No upper extremity supported Standing balance-Leahy Scale: Poor                              Communication Communication Communication: No apparent difficulties  Cognition Arousal: Alert Behavior During Therapy: WFL for tasks assessed/performed   PT - Cognitive impairments: Memory, Awareness, Safety/Judgement, No family/caregiver present to determine baseline                       PT - Cognition Comments: per case manager, pt with hx of cognitive deficits, pt unable to recall current month or year Following commands: Impaired Following commands impaired: Follows one step commands with increased time    Cueing Cueing Techniques: Verbal cues  Exercises Other Exercises Other Exercises: Pt performed 5x  sit<>stand from EOB without BUE support with max>mod assist with cuing re: increased anterior weight shifting, focus on BLE strengthening, balance, technique for sit>stand transfer.    General Comments General comments (skin integrity, edema, etc.): Pt with continued L beating nystagmus      Pertinent Vitals/Pain Pain Assessment Pain Assessment: No/denies pain    Home  Living                          Prior Function            PT Goals (current goals can now be found in the care plan section) Acute Rehab PT Goals Patient Stated Goal: to reduce dizziness PT Goal Formulation: With patient Time For Goal Achievement: 10/01/24 Potential to Achieve Goals: Good Progress towards PT goals: Progressing toward goals    Frequency           PT Plan      Co-evaluation              AM-PAC PT 6 Clicks Mobility   Outcome Measure  Help needed turning from your back to your side while in a flat bed without using bedrails?: None Help needed moving from lying on your back to sitting on the side of a flat bed without using bedrails?: None Help needed moving to and from a bed to a chair (including a wheelchair)?: A Little Help needed standing up from a chair using your arms (e.g., wheelchair or bedside chair)?: A Little Help needed to walk in hospital room?: A Little Help needed climbing 3-5 steps with a railing? : A Little 6 Click Score: 20    End of Session   Activity Tolerance: Patient tolerated treatment well Patient left: in chair;with chair alarm set;with call bell/phone within reach Nurse Communication: Mobility status PT Visit Diagnosis: Unsteadiness on feet (R26.81);Muscle weakness (generalized) (M62.81);Dizziness and giddiness (R42)     Time: 1056-1110 PT Time Calculation (min) (ACUTE ONLY): 14 min  Charges:    $Therapeutic Activity: 8-22 mins PT General Charges $$ ACUTE PT VISIT: 1 Visit                     Richerd Pinal, PT, DPT 09/18/2024, 11:16 AM   Richerd CHRISTELLA Pinal 09/18/2024, 11:15 AM

## 2024-09-18 NOTE — Progress Notes (Signed)
" °  Progress Note   Patient: Lisa Thompson FMW:[id number] DOB: Aug 04, 1946 DOA: 09/16/2024     1 DOS: the patient was seen and examined on 09/18/2024   Brief hospital course: Lisa Thompson is a pleasant 79 y.o. female with medical history significant for  HTN, HLD, Arthritis, pre-diabetes, motion sickness  and prior CVA who was brought in from home with complaints of dizziness and vertigo upon weakening. MRI of the brain did not show any acute changes.  MRI of the brain did not show any major occlusion.  Patient is seen by neurology, ordered carotid ultrasound.   Principal Problem:   Vertigo, benign positional Active Problems:   Benign hypertension   CKD stage 3a, GFR 45-59 ml/min (HCC)   Assessment and Plan: # Vertigo, BPV left ear  With peripheral nystagmus - Benign positional paroxysmal vertigo BPPV Discussed with neurology, want to perform carotid ultrasound before discharge.   # Sinus bradycardia without any chest pain - She takes bisoprolol  daily along with amlodipine  for hypertension. Slowest heart rate was 46, asymptomatic.  Not a concern   #  HTN - Continue bisoprolol  and amlodipine  at home dose.    # History of stroke  - Supportive care  Chronic kidney disease stage IIIa. Renal function slightly worsened since admission, given gentle rehydration.       Subjective:  Patient doing well, no longer has significant dizziness  Physical Exam: Vitals:   09/18/24 0411 09/18/24 0821 09/18/24 1235 09/18/24 1644  BP: (!) 151/106 (!) 182/67 (!) 146/69 (!) 172/63  Pulse: (!) 55 (!) 54 64 (!) 54  Resp: 18  17 16   Temp: 98.1 F (36.7 C) 97.8 F (36.6 C) 97.6 F (36.4 C) 98.3 F (36.8 C)  TempSrc:  Oral Oral   SpO2: 98% 99% 100% 100%  Weight:      Height:       General exam: Appears calm and comfortable  Respiratory system: Clear to auscultation. Respiratory effort normal. Cardiovascular system: S1 & S2 heard, RRR. No JVD, murmurs, rubs, gallops or clicks. No  pedal edema. Gastrointestinal system: Abdomen is nondistended, soft and nontender. No organomegaly or masses felt. Normal bowel sounds heard. Central nervous system: Alert and oriented. No focal neurological deficits. Extremities: Symmetric 5 x 5 power. Skin: No rashes, lesions or ulcers Psychiatry: Judgement and insight appear normal. Mood & affect appropriate.    Data Reviewed:  Reviewed MRI, MRI and the lab results.  Family Communication: None  Disposition: Status is: Inpatient Remains inpatient appropriate because: Severity of disease     Time spent: 35 minutes  Author: Murvin Mana, MD 09/18/2024 4:49 PM  For on call review [email or web address].    "

## 2024-09-18 NOTE — Hospital Course (Addendum)
 Lisa Thompson is a pleasant 79 y.o. female with medical history significant for  HTN, HLD, Arthritis, pre-diabetes, motion sickness  and prior CVA who was brought in from home with complaints of dizziness and vertigo upon weakening. MRI of the brain did not show any acute changes.  MRI of the brain did not show any major occlusion.  Patient is seen by neurology, carotid ultrasound only showed mild stenosis. Discussed with neurology, patient condition most likely is benign positional vertigo.  No need for Plavix .  Patient will be discharged home with PT/OT.

## 2024-09-18 NOTE — Plan of Care (Signed)

## 2024-09-19 ENCOUNTER — Other Ambulatory Visit: Payer: Self-pay

## 2024-09-19 ENCOUNTER — Inpatient Hospital Stay

## 2024-09-19 LAB — BASIC METABOLIC PANEL WITH GFR
Anion gap: 10 (ref 5–15)
BUN: 23 mg/dL (ref 8–23)
CO2: 27 mmol/L (ref 22–32)
Calcium: 9.1 mg/dL (ref 8.9–10.3)
Chloride: 105 mmol/L (ref 98–111)
Creatinine, Ser: 0.95 mg/dL (ref 0.44–1.00)
GFR, Estimated: >60 mL/min
Glucose, Bld: 99 mg/dL (ref 70–99)
Potassium: 3.4 mmol/L — ABNORMAL LOW (ref 3.5–5.1)
Sodium: 141 mmol/L (ref 135–145)

## 2024-09-19 LAB — GLUCOSE, CAPILLARY: Glucose-Capillary: 108 mg/dL — ABNORMAL HIGH (ref 70–99)

## 2024-09-19 LAB — MAGNESIUM: Magnesium: 2 mg/dL (ref 1.7–2.4)

## 2024-09-19 MED ORDER — MECLIZINE HCL 25 MG PO TABS
25.0000 mg | ORAL_TABLET | Freq: Three times a day (TID) | ORAL | 0 refills | Status: AC | PRN
  Filled 2024-09-19: qty 30, 10d supply, fill #0

## 2024-09-19 MED ORDER — POTASSIUM CHLORIDE CRYS ER 20 MEQ PO TBCR
40.0000 meq | EXTENDED_RELEASE_TABLET | Freq: Once | ORAL | Status: AC
  Administered 2024-09-19: 40 meq via ORAL
  Filled 2024-09-19: qty 2

## 2024-09-19 NOTE — TOC Transition Note (Signed)
 Transition of Care [facility]) - Discharge Note   Patient Details  Name: Lisa Thompson MRN: [id number] Date of Birth: 1945-10-08  Transition of Care [facility]) CM/SW Contact:  [name], RN Phone Number: 09/19/2024, 10:44 AM   Clinical Narrative:    Patient is medically cleared for discharge today.  [facility] will accept her, talked with CM Stacy at [facility], she will arrange transportation and text me with a time of arrival, told her to have the Driver call the unit at [phone] when they [place] the medical mall.  Spoke with Adrien at [facility] patient has a room 113 A.  [facility] to [facility] and PT and OT will evaluate her there and the provider will also assess her.  Daughter is aware of DC today and that [facility] is providing transportation.     Final next level of care: Skilled Nursing Facility Barriers to Discharge: Barriers Resolved   Patient Goals and CMS Choice            Discharge Placement PASRR number recieved: 09/19/24            Patient chooses bed at: [facility] Patient to be transferred to facility by: [facility] transport Name of family member notified: Rosaline Seals Patient and family notified of of transfer: 09/19/24  Discharge Plan and Services Additional resources added to the After Visit Summary for     Discharge Planning Services: CM Consult            DME Arranged: N/A         HH Arranged: NA          Social Drivers of Health (SDOH) Interventions SDOH Screenings   Food Insecurity: No Food Insecurity (09/16/2024)  Housing: Low Risk (09/16/2024)  Transportation Needs: No Transportation Needs (09/16/2024)  Utilities: Not At Risk (09/16/2024)  Alcohol Screen: Low Risk (02/03/2022)  Depression (PHQ2-9): Low Risk (03/22/2023)  Social Connections: Moderately Integrated (09/16/2024)  Tobacco Use: Low Risk (09/16/2024)     Readmission Risk Interventions     No data to display

## 2024-09-19 NOTE — Plan of Care (Signed)

## 2024-09-19 NOTE — [place] FL2 (Signed)
 " [place]  MEDICAID FL2 LEVEL OF CARE FORM     IDENTIFICATION  Patient Name: Lisa Thompson Birthdate: 06-Oct-1945 Sex: female Admission Date (Current Location): 09/16/2024  [facility] and [place] Number:  Chiropodist and Address:  [facility], [address] [place], [place] [address]      Provider Number: [id number]  Attending Physician Name and Address:  Laurita Pillion, MD  Relative Name and Phone Number:  Olivia Seals- daughter- [phone]    Current Level of Care: Hospital Recommended Level of Care: Skilled Nursing Facility Prior Approval Number:    Date Approved/Denied:   PASRR Number: [id number]  Discharge Plan: SNF    Current Diagnoses: Patient Active Problem List   Diagnosis Date Noted   CKD stage 3a, GFR 45-59 ml/min (HCC) 09/18/2024   Vertigo, benign positional 09/16/2024   Forgetfulness 12/21/2022   Left foot pain 12/29/2020   Low back pain 06/22/2020   Encounter for screening mammogram for malignant neoplasm of breast 03/16/2020   Primary insomnia 09/30/2019   Other fatigue 09/30/2019   Benign neoplasm of right kidney 05/29/2019   Abnormal renal function 04/26/2019   Mixed hyperlipidemia 04/26/2019   Degenerative disc disease, lumbar 03/03/2019   Pruritic dermatitis 03/03/2019   Urinary tract infection with hematuria 02/27/2018   Dysuria 02/27/2018   Encounter for general adult medical examination with abnormal findings 02/27/2018   Vitamin D  deficiency 02/27/2018   Benign hypertension 10/30/2017   Cervical disc disorder at C5-C6 level with radiculopathy 10/30/2017    Orientation RESPIRATION BLADDER Height & Weight     Self, Place  Normal Continent Weight: 69.9 kg Height:  5' 5 (165.1 cm)  BEHAVIORAL SYMPTOMS/MOOD NEUROLOGICAL BOWEL NUTRITION STATUS      Continent Diet (Regular)  AMBULATORY STATUS COMMUNICATION OF NEEDS Skin   Supervision Verbally Normal                       Personal Care  Assistance Level of Assistance  Bathing, Feeding, Dressing Bathing Assistance: Limited assistance Feeding assistance: Independent Dressing Assistance: Limited assistance     Functional Limitations Info  Sight Sight Info: Adequate (Glasses)        SPECIAL CARE FACTORS FREQUENCY  PT (By licensed PT), OT (By licensed OT)                    Contractures Contractures Info: Not present    Additional Factors Info  Code Status, Allergies Code Status Info: Full Allergies Info: Aspirin Not Specified  Nausea And Vomiting   Latex Not Specified  Itching, Swelling   Other Not Specified   Heinz Ketchup causes throat to swell  Shellfish Allergy Not Specified  Itching, Swelling Of mouth and throat           Current Medications (09/19/2024):  This is the current hospital active medication list Current Facility-Administered Medications  Medication Dose Route Frequency Provider Last Rate Last Admin   acetaminophen  (TYLENOL ) tablet 650 mg  650 mg Oral Q4H PRN Paudel, Keshab, MD       Or   acetaminophen  (TYLENOL ) 160 MG/5ML solution 650 mg  650 mg Per Tube Q4H PRN Paudel, Keshab, MD       Or   acetaminophen  (TYLENOL ) suppository 650 mg  650 mg Rectal Q4H PRN Paudel, Nena, MD       amLODipine  (NORVASC ) tablet 10 mg  10 mg Oral Daily Zhang, Dekui, MD   10 mg at 09/19/24 0917   bisoprolol  (ZEBETA ) tablet  5 mg  5 mg Oral Daily Von Bellis, MD   5 mg at 09/18/24 0901   clopidogrel  (PLAVIX ) tablet 75 mg  75 mg Oral Daily Lindzen, Eric, MD   75 mg at 09/19/24 9082   enoxaparin  (LOVENOX ) injection 40 mg  40 mg Subcutaneous Daily Greenwood, Howard F, RPH   40 mg at 09/19/24 9081   hydrALAZINE  (APRESOLINE ) injection 10 mg  10 mg Intravenous Q6H PRN Roann Gouty, MD   10 mg at 09/19/24 0441   insulin  aspart (novoLOG ) injection 0-15 Units  0-15 Units Subcutaneous TID WC Kumar, Dileep, MD   3 Units at 09/18/24 1221   meclizine  (ANTIVERT ) tablet 12.5 mg  12.5 mg Oral Once Quale, Mark, MD        meclizine  (ANTIVERT ) tablet 25 mg  25 mg Oral TID PRN Paudel, Keshab, MD   25 mg at 09/16/24 1424   senna-docusate (Senokot-S) tablet 1 tablet  1 tablet Oral QHS PRN Paudel, Keshab, MD         Discharge Medications: Please see discharge summary for a list of discharge medications.  Relevant Imaging Results:  Relevant Lab Results:   Additional Information SS# [id number]  [name], RN     "

## 2024-09-19 NOTE — Progress Notes (Signed)
 The patient's IV to her Right FA is not flushing and there is no blood return. This writer put in an IV consult for a new IV to be placed d/t the patient needs PRN Hydralazine  for elevated BP.

## 2024-09-19 NOTE — TOC Progression Note (Signed)
 Transition of Care [facility]) - Progression Note    Patient Details  Name: Lisa Thompson MRN: [id number] Date of Birth: [birth year]  Transition of Care [facility] CM/SW Contact  [name], RN Phone Number: 09/19/2024, 9:47 AM  Clinical Narrative:    [facility] reached out and asked that we send the referral to their contracted facilities, [facility], [place], and [facility].  FL2 and PASRR completed, bed search started.    Expected Discharge Plan: Home/Self Care Barriers to Discharge: Continued Medical Work up               Expected Discharge Plan and Services   Discharge Planning Services: CM Consult   Living arrangements for the past 2 months: Apartment                 DME Arranged: N/A         HH Arranged: NA           Social Drivers of Health (SDOH) Interventions SDOH Screenings   Food Insecurity: No Food Insecurity (09/16/2024)  Housing: Low Risk (09/16/2024)  Transportation Needs: No Transportation Needs (09/16/2024)  Utilities: Not At Risk (09/16/2024)  Alcohol Screen: Low Risk (02/03/2022)  Depression (PHQ2-9): Low Risk (03/22/2023)  Social Connections: Moderately Integrated (09/16/2024)  Tobacco Use: Low Risk (09/16/2024)    Readmission Risk Interventions     No data to display

## 2024-09-19 NOTE — Discharge Summary (Signed)
 " Physician Discharge Summary   Patient: Lisa Thompson MRN: [id number] DOB: [birth year]  Admit date:     09/16/2024  Discharge date: 09/19/2024  Discharge Physician: Lisa Thompson   PCP: Lisa Sonny FALCON, NP   Recommendations at discharge:   Follow-up with PCP in 1 week  Discharge Diagnoses: Principal Problem:   Vertigo, benign positional Active Problems:   Benign hypertension   CKD stage 3a, GFR 45-59 ml/min (HCC)  Resolved Problems:   * No resolved hospital problems. *  Hospital Course: Lisa Thompson is a pleasant 79 y.o. female with medical history significant for  HTN, HLD, Arthritis, pre-diabetes, motion sickness  and prior CVA who was brought in from home with complaints of dizziness and vertigo upon weakening. MRI of the brain did not show any acute changes.  MRI of the brain did not show any major occlusion.  Patient is seen by neurology, carotid ultrasound only showed mild stenosis. Discussed with neurology, patient condition most likely is benign positional vertigo.  No need for Plavix .  Patient will be discharged home with PT/OT.  Assessment and Plan: # Vertigo, BPV left ear  With peripheral nystagmus - Benign positional paroxysmal vertigo BPPV Carotid ultrasound showed mild stenosis, patient condition is consistent with benign positional vertigo.  Continue meclizine  as needed, continue PT/OT.   # Sinus bradycardia without any chest pain - She takes bisoprolol  daily along with amlodipine  for hypertension. Slowest heart rate was 46, asymptomatic.  Not a concern   #  HTN - Continue bisoprolol  and amlodipine  at home dose.     # History of stroke  - Supportive care   Chronic kidney disease stage IIIa. Hypokalemia Function much better after giving IV fluids.  Replete potassium for level was 3.4        Consultants: Neurology Procedures performed: None  Disposition: Home health Diet recommendation:  Discharge Diet Orders (From admission, onward)     Start      Ordered   09/19/24 0000  Diet - low sodium heart healthy        09/19/24 1006           Cardiac diet DISCHARGE MEDICATION: Allergies as of 09/19/2024       Reactions   Aspirin Nausea And Vomiting   Latex Itching, Swelling   Other    Heinz Ketchup causes throat to swell   Shellfish Allergy Itching, Swelling   Of mouth and throat        Medication List     STOP taking these medications    carbamide peroxide 6.5 % OTIC solution Commonly known as: DEBROX       TAKE these medications    amLODipine  5 MG tablet Commonly known as: NORVASC  TAKE 1 TABLET BY MOUTH AT BEDTIME FOR BLOOD PRESSURE   bisoprolol  5 MG tablet Commonly known as: ZEBETA  Take 1 tablet (5 mg total) by mouth daily.   Centrum Silver  Adult 50+ Tabs Take 1 tab po daily   meclizine  25 MG tablet Commonly known as: ANTIVERT  Take 1 tablet (25 mg total) by mouth 3 (three) times daily as needed for dizziness.   meloxicam  15 MG tablet Commonly known as: MOBIC  Take 1 tablet (15 mg total) by mouth daily.   methocarbamol  500 MG tablet Commonly known as: ROBAXIN  Take 1 tablet (500 mg total) by mouth 2 (two) times daily.        Follow-up Information     Herring, Sonny FALCON, NP Follow up in 1 week(s).   Specialty: Family Medicine  Contact information: [address] [place] [address] [phone]                Discharge Exam: Lisa Thompson   09/16/24 0918  Weight: 69.9 kg   General exam: Appears calm and comfortable  Respiratory system: Clear to auscultation. Respiratory effort normal. Cardiovascular system: S1 & S2 heard, RRR. No JVD, murmurs, rubs, gallops or clicks. No pedal edema. Gastrointestinal system: Abdomen is nondistended, soft and nontender. No organomegaly or masses felt. Normal bowel sounds heard. Central nervous system: Alert and oriented. No focal neurological deficits. Extremities: Symmetric 5 x 5 power. Skin: No rashes, lesions or ulcers Psychiatry: Judgement  and insight appear normal. Mood & affect appropriate.    Condition at discharge: good  The results of significant diagnostics from this hospitalization (including imaging, microbiology, ancillary and laboratory) are listed below for reference.   Imaging Studies: US  Carotid Bilateral Result Date: 09/19/2024 CLINICAL DATA:  Transient ischemic attack EXAM: BILATERAL CAROTID DUPLEX ULTRASOUND TECHNIQUE: Elnor scale imaging, color Doppler and duplex ultrasound were performed of bilateral carotid and vertebral arteries in the neck. COMPARISON:  None Available. FINDINGS: Criteria: Quantification of carotid stenosis is based on velocity parameters that correlate the residual internal carotid diameter with NASCET-based stenosis levels, using the diameter of the distal internal carotid lumen as the denominator for stenosis measurement. The following velocity measurements were obtained: RIGHT ICA: 106/15 cm/sec CCA: 76/14 cm/sec SYSTOLIC ICA/CCA RATIO:  1.4 ECA:  129 cm/sec LEFT ICA: 108/27 cm/sec CCA: 76/19 cm/sec SYSTOLIC ICA/CCA RATIO:  1.4 ECA:  102 cm/sec RIGHT CAROTID ARTERY: Heterogeneous and echogenic atherosclerotic plaque in the proximal internal carotid artery. By peak systolic velocity criteria, the estimated stenosis is less than 50%. RIGHT VERTEBRAL ARTERY:  Patent with normal antegrade flow. LEFT CAROTID ARTERY: Heterogeneous atherosclerotic plaque in the proximal internal carotid artery. Plaque volume is mild. By peak systolic velocity criteria, the estimated stenosis is less than 50%. LEFT VERTEBRAL ARTERY:  Patent with normal antegrade flow. IMPRESSION: 1. Mild (1-49%) stenosis proximal right internal carotid artery secondary to echogenic/calcified atherosclerotic plaque. 2. Mild (1-49%) stenosis proximal left internal carotid artery secondary to heterogeneous atherosclerotic plaque. 3. Vertebral arteries are patent with normal antegrade flow. Electronically Signed   By: Wilkie Lent M.D.   On:  09/19/2024 09:35   ECHOCARDIOGRAM COMPLETE Result Date: 09/17/2024    ECHOCARDIOGRAM REPORT   Patient Name:   Lisa Thompson Date of Exam: 09/17/2024 Medical Rec #:  [id number]        Height:       65.0 in Accession #:    [id number]       Weight:       154.0 lb Date of Birth:  February 20, 1946        BSA:          1.770 m Patient Age:    79 years         BP:           176/63 mmHg Patient Gender: F                HR:           49 bpm. Exam Location:  [facility] Procedure: 2D Echo, Saline Contrast Bubble Study, Cardiac Doppler, Color Doppler            and Strain Analysis (Both Spectral and Color Flow Doppler were            utilized during procedure). Indications:     TIA G45.9  History:  Patient has prior history of Echocardiogram examinations, most                  recent 10/22/2021. Risk Factors:Hypertension.  Sonographer:     Christopher Furnace Referring Phys:  [id number] [facility] PAUDEL Diagnosing Phys: Cara JONETTA Lovelace MD  Sonographer Comments: Global longitudinal strain was attempted. IMPRESSIONS  1. Left ventricular ejection fraction, by estimation, is 70 to 75%. The left ventricle has hyperdynamic function. The left ventricle has no regional wall motion abnormalities. Left ventricular diastolic parameters are consistent with Grade I diastolic dysfunction (impaired relaxation). The average left ventricular global longitudinal strain is 16.9 %. The global longitudinal strain is normal.  2. Right ventricular systolic function is normal. The right ventricular size is normal.  3. The mitral valve is normal in structure. No evidence of mitral valve regurgitation.  4. The aortic valve is normal in structure. Aortic valve regurgitation is trivial. Aortic valve sclerosis is present, with no evidence of aortic valve stenosis. FINDINGS  Left Ventricle: Left ventricular ejection fraction, by estimation, is 70 to 75%. The left ventricle has hyperdynamic function. The left ventricle has no regional wall motion abnormalities. The average  left ventricular global longitudinal strain is 16.9 %. Strain was performed and the global longitudinal strain is normal. The left ventricular internal cavity size was normal in size. There is no left ventricular hypertrophy. Left ventricular diastolic parameters are consistent with Grade I diastolic dysfunction (impaired relaxation). Right Ventricle: The right ventricular size is normal. No increase in right ventricular wall thickness. Right ventricular systolic function is normal. Left Atrium: Left atrial size was normal in size. Right Atrium: Right atrial size was normal in size. Pericardium: There is no evidence of pericardial effusion. Mitral Valve: The mitral valve is normal in structure. No evidence of mitral valve regurgitation. MV peak gradient, 5.2 mmHg. The mean mitral valve gradient is 2.0 mmHg. Tricuspid Valve: The tricuspid valve is normal in structure. Tricuspid valve regurgitation is trivial. Aortic Valve: The aortic valve is normal in structure. Aortic valve regurgitation is trivial. Aortic valve sclerosis is present, with no evidence of aortic valve stenosis. Aortic valve mean gradient measures 2.0 mmHg. Aortic valve peak gradient measures 3.8 mmHg. Aortic valve area, by VTI measures 3.49 cm. Pulmonic Valve: The pulmonic valve was not well visualized. Pulmonic valve regurgitation is not visualized. Aorta: The ascending aorta was not well visualized. IAS/Shunts: No atrial level shunt detected by color flow Doppler. Agitated saline contrast was given intravenously to evaluate for intracardiac shunting. Additional Comments: 3D was performed not requiring image post processing on an independent workstation and was indeterminate.  LEFT VENTRICLE PLAX 2D LVIDd:         4.00 cm   Diastology LVIDs:         2.30 cm   LV e' medial:    5.44 cm/s LV PW:         0.90 cm   LV E/e' medial:  14.2 LV IVS:        1.00 cm   LV e' lateral:   4.79 cm/s LVOT diam:     2.00 cm   LV E/e' lateral: 16.1 LV SV:         91  LV SV Index:   52        2D Longitudinal Strain LVOT Area:     3.14 cm  2D Strain GLS (A4C):   18.8 % LV IVRT:       119 msec  2D Strain GLS (A3C):  12.5 %                          2D Strain GLS (A2C):   19.5 %                          2D Strain GLS Avg:     16.9 % RIGHT VENTRICLE RV Basal diam:  2.80 cm     PULMONARY VEINS RV Mid diam:    2.30 cm     Diastolic Velocity: 35.20 cm/s RV S prime:     11.90 cm/s  S/D Velocity:       1.70                             Systolic Velocity:  60.30 cm/s LEFT ATRIUM           Index        RIGHT ATRIUM           Index LA diam:      2.80 cm 1.58 cm/m   RA Area:     15.90 cm LA Vol (A2C): 33.6 ml 18.98 ml/m  RA Volume:   41.00 ml  23.16 ml/m LA Vol (A4C): 17.9 ml 10.11 ml/m  AORTIC VALVE AV Area (Vmax):    3.43 cm AV Area (Vmean):   3.40 cm AV Area (VTI):     3.49 cm AV Vmax:           97.90 cm/s AV Vmean:          65.800 cm/s AV VTI:            0.262 m AV Peak Grad:      3.8 mmHg AV Mean Grad:      2.0 mmHg LVOT Vmax:         107.00 cm/s LVOT Vmean:        71.300 cm/s LVOT VTI:          0.291 m LVOT/AV VTI ratio: 1.11  AORTA Ao Root diam: 3.10 cm MITRAL VALVE               TRICUSPID VALVE MV Area (PHT): 2.24 cm    TR Peak grad:   7.8 mmHg MV Area VTI:   2.18 cm    TR Vmax:        140.00 cm/s MV Peak grad:  5.2 mmHg MV Mean grad:  2.0 mmHg    SHUNTS MV Vmax:       1.14 m/s    Systemic VTI:  0.29 m MV Vmean:      61.9 cm/s   Systemic Diam: 2.00 cm MV Decel Time: 338 msec MV E velocity: 77.00 cm/s MV A velocity: 99.00 cm/s MV E/A ratio:  0.78 Dwayne D Callwood MD Electronically signed by Cara JONETTA Lovelace MD Signature Date/Time: 09/17/2024/4:51:37 PM    Final    MR ANGIO HEAD WO CONTRAST Result Date: 09/16/2024 EXAM: MRI BRAIN WITHOUT CONTRAST MRA HEAD WITHOUT CONTRAST 09/16/2024 12:15:37 PM TECHNIQUE: Multiplanar multisequence MRI of the brain was performed without the administration of intravenous contrast. MRA of the head was performed without contrast using  time-of-flight technique. 3D postprocessing with multiplanar reformations and MIPs was performed for better evaluation of the vasculature. COMPARISON: MRI brain 01/01/2023, CT head 04/16/2022, and MRI brain 06/06/2012. CLINICAL HISTORY: Vertigo, central; possible stroke. FINDINGS: MRI BRAIN: BRAIN AND VENTRICLES: No acute intracranial hemorrhage  or acute infarction. There is overall similar mild parenchymal volume loss. Re-demonstrated chronic right frontal lobe infarction. There is overall similar moderate-to-severe subcortical and periventricular T2 and FLAIR signal hyperintensity likely reflecting sequelae of chronic microvascular ischemia. Re-demonstrated chronic bilateral thalamic and basal ganglia infarctions. Chronic right corona radiata infarction. There is also a T2/FLAIR hyperintense intensity within the pons likely on the basis of chronic microvascular ischemia. Redemonstrated couple punctate foci of susceptibility in the right temporal lobe which may relate to the sequela of prior microhemorrhage. No mass effect. No midline shift. No hydrocephalus. The sella is unremarkable. ORBITS: Bilateral lens replacement. SINUSES AND MASTOIDS: Mucosal thickening/retention cysts left maxillary sinus. BONES AND SOFT TISSUES: Normal bone marrow signal. No acute soft tissue abnormality. MRA HEAD: ANTERIOR CIRCULATION: No significant stenosis of the internal carotid arteries. No significant stenosis of the anterior cerebral arteries. No significant stenosis of the middle cerebral arteries. No aneurysm. POSTERIOR CIRCULATION: Hypoplastic left P1 segment in the setting of a left posterior communicating artery. Hypoplastic right intracranial vertebral artery. No significant stenosis of the posterior cerebral arteries. No significant stenosis of the basilar artery. No significant stenosis of the vertebral arteries. No aneurysm. IMPRESSION: 1. MRI Brain: No acute intracranial abnormality. 2. MRA: No large vessel occlusion or  hemodynamically significant stenosis of the intracranial arteries. Electronically signed by: Prentice Spade MD 09/16/2024 01:26 PM EST RP Workstation: [id number]   MR BRAIN WO CONTRAST Result Date: 09/16/2024 EXAM: MRI BRAIN WITHOUT CONTRAST MRA HEAD WITHOUT CONTRAST 09/16/2024 12:15:37 PM TECHNIQUE: Multiplanar multisequence MRI of the brain was performed without the administration of intravenous contrast. MRA of the head was performed without contrast using time-of-flight technique. 3D postprocessing with multiplanar reformations and MIPs was performed for better evaluation of the vasculature. COMPARISON: MRI brain 01/01/2023, CT head 04/16/2022, and MRI brain 06/06/2012. CLINICAL HISTORY: Vertigo, central; possible stroke. FINDINGS: MRI BRAIN: BRAIN AND VENTRICLES: No acute intracranial hemorrhage or acute infarction. There is overall similar mild parenchymal volume loss. Re-demonstrated chronic right frontal lobe infarction. There is overall similar moderate-to-severe subcortical and periventricular T2 and FLAIR signal hyperintensity likely reflecting sequelae of chronic microvascular ischemia. Re-demonstrated chronic bilateral thalamic and basal ganglia infarctions. Chronic right corona radiata infarction. There is also a T2/FLAIR hyperintense intensity within the pons likely on the basis of chronic microvascular ischemia. Redemonstrated couple punctate foci of susceptibility in the right temporal lobe which may relate to the sequela of prior microhemorrhage. No mass effect. No midline shift. No hydrocephalus. The sella is unremarkable. ORBITS: Bilateral lens replacement. SINUSES AND MASTOIDS: Mucosal thickening/retention cysts left maxillary sinus. BONES AND SOFT TISSUES: Normal bone marrow signal. No acute soft tissue abnormality. MRA HEAD: ANTERIOR CIRCULATION: No significant stenosis of the internal carotid arteries. No significant stenosis of the anterior cerebral arteries. No significant stenosis of the  middle cerebral arteries. No aneurysm. POSTERIOR CIRCULATION: Hypoplastic left P1 segment in the setting of a left posterior communicating artery. Hypoplastic right intracranial vertebral artery. No significant stenosis of the posterior cerebral arteries. No significant stenosis of the basilar artery. No significant stenosis of the vertebral arteries. No aneurysm. IMPRESSION: 1. MRI Brain: No acute intracranial abnormality. 2. MRA: No large vessel occlusion or hemodynamically significant stenosis of the intracranial arteries. Electronically signed by: Prentice Spade MD 09/16/2024 01:26 PM EST RP Workstation: [id number]    Microbiology: Results for orders placed or performed in visit on 03/11/21  Microscopic Examination     Status: Abnormal   Collection Time: 03/11/21  9:00 AM   Urine  Result Value Ref Range  Status   WBC, UA 0-5 0 - 5 /hpf Final   RBC, Urine None seen 0 - 2 /hpf Final   Epithelial Cells (non renal) >10 (A) 0 - 10 /hpf Final   Casts None seen None seen /lpf Final   Crystals Present (A) N/A Final   Crystal Type Calcium Oxalate N/A Final   Bacteria, UA Many (A) None seen/Few Final  Urine Culture, Reflex     Status: None   Collection Time: 03/11/21  9:00 AM   Urine  Result Value Ref Range Status   Urine Culture, Routine Final report  Final   Organism ID, Bacteria Comment  Final    Comment: Mixed urogenital flora Less than 10,000 colonies/mL     Labs: CBC: Recent Labs  Lab 09/16/24 1031 09/18/24 0445  WBC 7.5 6.4  NEUTROABS 6.5  --   HGB 13.5 13.2  HCT 41.4 40.5  MCV 94.1 95.1  PLT 223 224   Basic Metabolic Panel: Recent Labs  Lab 09/16/24 0932 09/16/24 1031 09/17/24 1059 09/18/24 0445 09/18/24 1353 09/19/24 0612  NA 138  --  140 143 143 141  K 4.4  --  3.7 3.9 4.3 3.4*  CL 99  --  100 105 104 105  CO2 23  --  28 27 28 27   GLUCOSE 191*  --  145* 110* 99 99  BUN 17  --  17 35* 32* 23  CREATININE 1.06* 0.88 1.22* 1.59* 1.41* 0.95  CALCIUM 9.6  --  9.1  9.0 9.6 9.1  MG  --   --  2.2  --   --  2.0  PHOS  --   --  2.6  --   --   --    Liver Function Tests: Recent Labs  Lab 09/16/24 0932  AST 47*  ALT 20  ALKPHOS 77  BILITOT 0.8  PROT 8.0  ALBUMIN 4.5   CBG: Recent Labs  Lab 09/18/24 0843 09/18/24 1216 09/18/24 1648 09/18/24 2053 09/19/24 0901  GLUCAP 104* 162* 85 132* 108*    Discharge time spent: 35 minutes.  Signed: Murvin Mana, MD [facility] 09/19/2024 "
